# Patient Record
Sex: Male | Born: 1940 | Race: White | Hispanic: No | Smoking: Never smoker
Health system: Southern US, Community
[De-identification: ages and names within clinical notes are randomized; demographics above are authoritative.]

## PROBLEM LIST (undated history)

## (undated) DIAGNOSIS — M109 Gout, unspecified: Secondary | ICD-10-CM

## (undated) DIAGNOSIS — E782 Mixed hyperlipidemia: Secondary | ICD-10-CM

## (undated) DIAGNOSIS — F419 Anxiety disorder, unspecified: Secondary | ICD-10-CM

## (undated) DIAGNOSIS — C9110 Chronic lymphocytic leukemia of B-cell type not having achieved remission: Secondary | ICD-10-CM

## (undated) DIAGNOSIS — I10 Essential (primary) hypertension: Secondary | ICD-10-CM

## (undated) DIAGNOSIS — E119 Type 2 diabetes mellitus without complications: Secondary | ICD-10-CM

## (undated) DIAGNOSIS — Z972 Presence of dental prosthetic device (complete) (partial): Secondary | ICD-10-CM

## (undated) DIAGNOSIS — C9190 Lymphoid leukemia, unspecified not having achieved remission: Secondary | ICD-10-CM

## (undated) DIAGNOSIS — I2510 Atherosclerotic heart disease of native coronary artery without angina pectoris: Secondary | ICD-10-CM

## (undated) DIAGNOSIS — I219 Acute myocardial infarction, unspecified: Secondary | ICD-10-CM

## (undated) DIAGNOSIS — D239 Other benign neoplasm of skin, unspecified: Secondary | ICD-10-CM

## (undated) HISTORY — DX: Acute myocardial infarction, unspecified: I21.9

## (undated) HISTORY — DX: Type 2 diabetes mellitus without complications: E11.9

## (undated) HISTORY — DX: Other benign neoplasm of skin, unspecified: D23.9

## (undated) HISTORY — DX: Gout, unspecified: M10.9

## (undated) HISTORY — DX: Mixed hyperlipidemia: E78.2

## (undated) HISTORY — DX: Essential (primary) hypertension: I10

## (undated) HISTORY — PX: COLONOSCOPY: SHX174

## (undated) HISTORY — DX: Lymphoid leukemia, unspecified not having achieved remission: C91.90

## (undated) HISTORY — DX: Chronic lymphocytic leukemia of B-cell type not having achieved remission: C91.10

## (undated) HISTORY — PX: CARDIAC CATHETERIZATION: SHX172

## (undated) HISTORY — DX: Anxiety disorder, unspecified: F41.9

## (undated) HISTORY — DX: Atherosclerotic heart disease of native coronary artery without angina pectoris: I25.10

---

## 1996-01-23 DIAGNOSIS — D239 Other benign neoplasm of skin, unspecified: Secondary | ICD-10-CM

## 1996-01-23 DIAGNOSIS — I219 Acute myocardial infarction, unspecified: Secondary | ICD-10-CM

## 1996-01-23 HISTORY — DX: Acute myocardial infarction, unspecified: I21.9

## 1996-01-23 HISTORY — PX: CORONARY ARTERY BYPASS GRAFT: SHX141

## 1996-01-23 HISTORY — DX: Other benign neoplasm of skin, unspecified: D23.9

## 1997-11-25 ENCOUNTER — Other Ambulatory Visit: Admission: RE | Admit: 1997-11-25 | Discharge: 1997-11-25 | Payer: Self-pay | Admitting: Urology

## 1998-04-14 ENCOUNTER — Other Ambulatory Visit: Admission: RE | Admit: 1998-04-14 | Discharge: 1998-04-14 | Payer: Self-pay | Admitting: Urology

## 1998-10-24 ENCOUNTER — Encounter (INDEPENDENT_AMBULATORY_CARE_PROVIDER_SITE_OTHER): Payer: Self-pay

## 1998-10-24 ENCOUNTER — Ambulatory Visit (HOSPITAL_COMMUNITY): Admission: RE | Admit: 1998-10-24 | Discharge: 1998-10-24 | Payer: Self-pay | Admitting: Gastroenterology

## 1999-02-23 ENCOUNTER — Other Ambulatory Visit: Admission: RE | Admit: 1999-02-23 | Discharge: 1999-02-23 | Payer: Self-pay | Admitting: Urology

## 1999-08-22 ENCOUNTER — Encounter: Admission: RE | Admit: 1999-08-22 | Discharge: 1999-11-20 | Payer: Self-pay | Admitting: Obstetrics and Gynecology

## 1999-12-21 ENCOUNTER — Other Ambulatory Visit: Admission: RE | Admit: 1999-12-21 | Discharge: 1999-12-21 | Payer: Self-pay | Admitting: Urology

## 1999-12-21 ENCOUNTER — Encounter (INDEPENDENT_AMBULATORY_CARE_PROVIDER_SITE_OTHER): Payer: Self-pay | Admitting: Specialist

## 2001-04-15 ENCOUNTER — Ambulatory Visit (HOSPITAL_BASED_OUTPATIENT_CLINIC_OR_DEPARTMENT_OTHER): Admission: RE | Admit: 2001-04-15 | Discharge: 2001-04-15 | Payer: Self-pay | Admitting: Otolaryngology

## 2001-10-08 ENCOUNTER — Ambulatory Visit (HOSPITAL_COMMUNITY): Admission: RE | Admit: 2001-10-08 | Discharge: 2001-10-08 | Payer: Self-pay | Admitting: Cardiology

## 2002-05-07 ENCOUNTER — Ambulatory Visit (HOSPITAL_COMMUNITY): Admission: RE | Admit: 2002-05-07 | Discharge: 2002-05-07 | Payer: Self-pay | Admitting: Gastroenterology

## 2002-06-18 ENCOUNTER — Other Ambulatory Visit: Admission: RE | Admit: 2002-06-18 | Discharge: 2002-06-18 | Payer: Self-pay | Admitting: Oncology

## 2002-06-29 ENCOUNTER — Encounter: Payer: Self-pay | Admitting: General Surgery

## 2002-06-30 ENCOUNTER — Encounter (INDEPENDENT_AMBULATORY_CARE_PROVIDER_SITE_OTHER): Payer: Self-pay | Admitting: Specialist

## 2002-06-30 ENCOUNTER — Ambulatory Visit (HOSPITAL_COMMUNITY): Admission: RE | Admit: 2002-06-30 | Discharge: 2002-06-30 | Payer: Self-pay | Admitting: General Surgery

## 2002-09-24 ENCOUNTER — Other Ambulatory Visit: Admission: RE | Admit: 2002-09-24 | Discharge: 2002-09-24 | Payer: Self-pay | Admitting: Oncology

## 2002-10-21 ENCOUNTER — Ambulatory Visit (HOSPITAL_COMMUNITY): Admission: RE | Admit: 2002-10-21 | Discharge: 2002-10-21 | Payer: Self-pay | Admitting: Neurology

## 2002-10-21 ENCOUNTER — Encounter: Payer: Self-pay | Admitting: Neurology

## 2003-07-21 ENCOUNTER — Encounter (INDEPENDENT_AMBULATORY_CARE_PROVIDER_SITE_OTHER): Payer: Self-pay | Admitting: Specialist

## 2003-07-21 ENCOUNTER — Ambulatory Visit (HOSPITAL_COMMUNITY): Admission: RE | Admit: 2003-07-21 | Discharge: 2003-07-21 | Payer: Self-pay | Admitting: Gastroenterology

## 2003-12-10 ENCOUNTER — Ambulatory Visit: Payer: Self-pay | Admitting: Oncology

## 2004-03-09 ENCOUNTER — Ambulatory Visit: Payer: Self-pay | Admitting: Oncology

## 2004-06-01 ENCOUNTER — Ambulatory Visit: Payer: Self-pay | Admitting: Oncology

## 2004-07-05 ENCOUNTER — Ambulatory Visit (HOSPITAL_COMMUNITY): Admission: RE | Admit: 2004-07-05 | Discharge: 2004-07-05 | Payer: Self-pay | Admitting: Cardiology

## 2004-07-17 ENCOUNTER — Ambulatory Visit: Payer: Self-pay | Admitting: Oncology

## 2004-07-23 ENCOUNTER — Emergency Department (HOSPITAL_COMMUNITY): Admission: EM | Admit: 2004-07-23 | Discharge: 2004-07-23 | Payer: Self-pay | Admitting: Emergency Medicine

## 2004-08-07 ENCOUNTER — Encounter (INDEPENDENT_AMBULATORY_CARE_PROVIDER_SITE_OTHER): Payer: Self-pay | Admitting: *Deleted

## 2004-08-07 ENCOUNTER — Ambulatory Visit (HOSPITAL_COMMUNITY): Admission: RE | Admit: 2004-08-07 | Discharge: 2004-08-07 | Payer: Self-pay | Admitting: Surgery

## 2004-12-21 ENCOUNTER — Ambulatory Visit: Payer: Self-pay | Admitting: Oncology

## 2005-02-15 ENCOUNTER — Ambulatory Visit: Payer: Self-pay | Admitting: Oncology

## 2005-06-29 ENCOUNTER — Ambulatory Visit: Payer: Self-pay | Admitting: Oncology

## 2005-07-03 LAB — CBC WITH DIFFERENTIAL/PLATELET
BASO%: 0.8 % (ref 0.0–2.0)
Basophils Absolute: 0.9 10*3/uL — ABNORMAL HIGH (ref 0.0–0.1)
Eosinophils Absolute: 0.1 10*3/uL (ref 0.0–0.5)
HCT: 43.7 % (ref 38.7–49.9)
HGB: 14.2 g/dL (ref 13.0–17.1)
MONO#: 5 10*3/uL — ABNORMAL HIGH (ref 0.1–0.9)
NEUT%: 6.3 % — ABNORMAL LOW (ref 40.0–75.0)
WBC: 111.5 10*3/uL (ref 4.0–10.0)
lymph#: 98.5 10*3/uL — ABNORMAL HIGH (ref 0.9–3.3)

## 2005-07-05 LAB — IMMUNOFIXATION ELECTROPHORESIS: IgM, Serum: 28 mg/dL — ABNORMAL LOW (ref 60–263)

## 2005-07-05 LAB — COMPREHENSIVE METABOLIC PANEL
ALT: 29 U/L (ref 0–40)
BUN: 20 mg/dL (ref 6–23)
CO2: 26 mEq/L (ref 19–32)
Calcium: 9.3 mg/dL (ref 8.4–10.5)
Chloride: 106 mEq/L (ref 96–112)
Creatinine, Ser: 1.03 mg/dL (ref 0.40–1.50)
Glucose, Bld: 103 mg/dL — ABNORMAL HIGH (ref 70–99)

## 2005-07-05 LAB — BETA 2 MICROGLOBULIN, SERUM: Beta-2 Microglobulin: 2.12 mg/L — ABNORMAL HIGH (ref 1.01–1.73)

## 2005-08-29 ENCOUNTER — Ambulatory Visit: Payer: Self-pay | Admitting: Oncology

## 2005-08-31 LAB — CBC WITH DIFFERENTIAL/PLATELET
BASO%: 2.3 % — ABNORMAL HIGH (ref 0.0–2.0)
EOS%: 0.1 % (ref 0.0–7.0)
LYMPH%: 80 % — ABNORMAL HIGH (ref 14.0–48.0)
MCH: 28.8 pg (ref 28.0–33.4)
MCHC: 32.6 g/dL (ref 32.0–35.9)
MCV: 88.3 fL (ref 81.6–98.0)
MONO%: 9.5 % (ref 0.0–13.0)
Platelets: 172 10*3/uL (ref 145–400)
RBC: 4.74 10*6/uL (ref 4.20–5.71)
WBC: 119.3 10*3/uL (ref 4.0–10.0)

## 2005-09-04 LAB — COMPREHENSIVE METABOLIC PANEL
ALT: 19 U/L (ref 0–40)
Alkaline Phosphatase: 72 U/L (ref 39–117)
Creatinine, Ser: 1.06 mg/dL (ref 0.40–1.50)
Sodium: 141 mEq/L (ref 135–145)
Total Bilirubin: 1 mg/dL (ref 0.3–1.2)
Total Protein: 6.9 g/dL (ref 6.0–8.3)

## 2005-09-04 LAB — IMMUNOFIXATION ELECTROPHORESIS

## 2005-10-26 ENCOUNTER — Ambulatory Visit: Payer: Self-pay | Admitting: Oncology

## 2006-01-08 ENCOUNTER — Ambulatory Visit: Payer: Self-pay | Admitting: Oncology

## 2006-02-21 LAB — CBC WITH DIFFERENTIAL/PLATELET
Basophils Absolute: 0.9 10*3/uL — ABNORMAL HIGH (ref 0.0–0.1)
EOS%: 0 % (ref 0.0–7.0)
HCT: 44.6 % (ref 38.7–49.9)
HGB: 14.6 g/dL (ref 13.0–17.1)
MONO#: 3.7 10*3/uL — ABNORMAL HIGH (ref 0.1–0.9)
NEUT#: 10.1 10*3/uL — ABNORMAL HIGH (ref 1.5–6.5)
NEUT%: 6.6 % — ABNORMAL LOW (ref 40.0–75.0)
RDW: 15.8 % — ABNORMAL HIGH (ref 11.2–14.6)
WBC: 153.5 10*3/uL (ref 4.0–10.0)
lymph#: 138.8 10*3/uL — ABNORMAL HIGH (ref 0.9–3.3)

## 2006-02-22 LAB — IGG, IGA, IGM
IgA: 86 mg/dL (ref 68–378)
IgG (Immunoglobin G), Serum: 841 mg/dL (ref 694–1618)
IgM, Serum: 30 mg/dL — ABNORMAL LOW (ref 60–263)

## 2006-02-22 LAB — BETA 2 MICROGLOBULIN, SERUM: Beta-2 Microglobulin: 2.09 mg/L — ABNORMAL HIGH (ref 1.01–1.73)

## 2006-02-26 ENCOUNTER — Ambulatory Visit: Payer: Self-pay | Admitting: Oncology

## 2006-05-20 ENCOUNTER — Ambulatory Visit: Payer: Self-pay | Admitting: Oncology

## 2006-05-23 LAB — CBC WITH DIFFERENTIAL/PLATELET
EOS%: 0 % (ref 0.0–7.0)
LYMPH%: 92.4 % — ABNORMAL HIGH (ref 14.0–48.0)
MCH: 29.1 pg (ref 28.0–33.4)
MCV: 88.4 fL (ref 81.6–98.0)
MONO%: 1 % (ref 0.0–13.0)
RBC: 4.38 10*6/uL (ref 4.20–5.71)
RDW: 15.2 % — ABNORMAL HIGH (ref 11.2–14.6)

## 2006-05-23 LAB — MORPHOLOGY

## 2006-05-23 LAB — CHCC SMEAR

## 2006-05-27 LAB — FERRITIN: Ferritin: 31 ng/mL (ref 22–322)

## 2006-05-27 LAB — IMMUNOFIXATION ELECTROPHORESIS
IgG (Immunoglobin G), Serum: 709 mg/dL (ref 694–1618)
Total Protein, Serum Electrophoresis: 6.5 g/dL (ref 6.0–8.3)

## 2006-05-27 LAB — BETA 2 MICROGLOBULIN, SERUM: Beta-2 Microglobulin: 2 mg/L — ABNORMAL HIGH (ref 1.01–1.73)

## 2006-07-09 ENCOUNTER — Ambulatory Visit: Payer: Self-pay | Admitting: Oncology

## 2006-07-11 LAB — CBC & DIFF AND RETIC
BASO%: 0.4 % (ref 0.0–2.0)
EOS%: 0 % (ref 0.0–7.0)
LYMPH%: 91.6 % — ABNORMAL HIGH (ref 14.0–48.0)
MCH: 28.9 pg (ref 28.0–33.4)
MCHC: 32.5 g/dL (ref 32.0–35.9)
MCV: 88.8 fL (ref 81.6–98.0)
MONO%: 2.7 % (ref 0.0–13.0)
NEUT#: 7.9 10*3/uL — ABNORMAL HIGH (ref 1.5–6.5)
Platelets: 167 10*3/uL (ref 145–400)
RBC: 4.61 10*6/uL (ref 4.20–5.71)
RDW: 15.8 % — ABNORMAL HIGH (ref 11.2–14.6)

## 2006-07-11 LAB — MORPHOLOGY

## 2006-07-11 LAB — CHCC SMEAR

## 2006-07-12 LAB — VITAMIN B12: Vitamin B-12: 344 pg/mL (ref 211–911)

## 2006-09-15 ENCOUNTER — Ambulatory Visit: Payer: Self-pay | Admitting: Oncology

## 2006-09-18 LAB — CBC & DIFF AND RETIC
HCT: 39.3 % (ref 38.7–49.9)
HGB: 12.7 g/dL — ABNORMAL LOW (ref 13.0–17.1)
MCH: 28.9 pg (ref 28.0–33.4)
MCHC: 32.2 g/dL (ref 32.0–35.9)
MCV: 89.6 fL (ref 81.6–98.0)
Platelets: 163 10*3/uL (ref 145–400)
RBC: 4.38 10*6/uL (ref 4.20–5.71)
RDW: 15.9 % — ABNORMAL HIGH (ref 11.2–14.6)
WBC: 140.5 10*3/uL (ref 4.0–10.0)

## 2006-09-18 LAB — MANUAL DIFFERENTIAL
ALC: 132.1 10*3/uL — ABNORMAL HIGH (ref 0.9–3.3)
ANC (CHCC manual diff): 4.2 10*3/uL (ref 1.5–6.5)
Band Neutrophils: 0 % (ref 0–10)
Basophil: 0 % (ref 0–2)
Blasts: 0 % (ref 0–0)
LYMPH: 94 % — ABNORMAL HIGH (ref 14–49)
Other Cell: 0 % (ref 0–0)
PLT EST: ADEQUATE
PROMYELO: 0 % (ref 0–0)
SEG: 3 % — ABNORMAL LOW (ref 38–77)
Variant Lymph: 0 % (ref 0–0)
nRBC: 0 % (ref 0–0)

## 2006-09-18 LAB — RETICULOCYTES (CHCC): Retic Ct Pct: 1.6 % (ref 0.4–3.1)

## 2006-09-19 LAB — COMPREHENSIVE METABOLIC PANEL
ALT: 13 U/L (ref 0–53)
CO2: 27 mEq/L (ref 19–32)
Calcium: 9 mg/dL (ref 8.4–10.5)
Chloride: 104 mEq/L (ref 96–112)
Creatinine, Ser: 1.1 mg/dL (ref 0.40–1.50)
Glucose, Bld: 106 mg/dL — ABNORMAL HIGH (ref 70–99)

## 2006-09-19 LAB — FERRITIN: Ferritin: 33 ng/mL (ref 22–322)

## 2006-09-19 LAB — LACTATE DEHYDROGENASE: LDH: 151 U/L (ref 94–250)

## 2006-12-30 ENCOUNTER — Ambulatory Visit: Payer: Self-pay | Admitting: Oncology

## 2007-01-01 LAB — COMPREHENSIVE METABOLIC PANEL
AST: 24 U/L (ref 0–37)
Albumin: 4 g/dL (ref 3.5–5.2)
Alkaline Phosphatase: 74 U/L (ref 39–117)
BUN: 11 mg/dL (ref 6–23)
Calcium: 9.4 mg/dL (ref 8.4–10.5)
Chloride: 107 mEq/L (ref 96–112)
Creatinine, Ser: 1.02 mg/dL (ref 0.40–1.50)
Glucose, Bld: 103 mg/dL — ABNORMAL HIGH (ref 70–99)
Potassium: 4.5 mEq/L (ref 3.5–5.3)

## 2007-01-01 LAB — MORPHOLOGY: PLT EST: ADEQUATE

## 2007-01-01 LAB — CBC & DIFF AND RETIC
BASO%: 0.5 % (ref 0.0–2.0)
EOS%: 0.1 % (ref 0.0–7.0)
HGB: 13.4 g/dL (ref 13.0–17.1)
MCH: 29 pg (ref 28.0–33.4)
MCHC: 32.7 g/dL (ref 32.0–35.9)
RDW: 15.5 % — ABNORMAL HIGH (ref 11.2–14.6)
WBC: 136.6 10*3/uL (ref 4.0–10.0)
lymph#: 128.4 10*3/uL — ABNORMAL HIGH (ref 0.9–3.3)

## 2007-01-01 LAB — CHCC SMEAR

## 2007-01-02 LAB — IGG, IGA, IGM
IgA: 71 mg/dL (ref 68–378)
IgG (Immunoglobin G), Serum: 718 mg/dL (ref 694–1618)
IgM, Serum: 22 mg/dL — ABNORMAL LOW (ref 60–263)

## 2007-01-02 LAB — RETICULOCYTES (CHCC)
ABS Retic: 42.3 10*3/uL (ref 19.0–186.0)
RBC.: 4.7 MIL/uL (ref 4.22–5.81)

## 2007-04-15 ENCOUNTER — Ambulatory Visit: Payer: Self-pay | Admitting: Oncology

## 2007-04-24 LAB — URINALYSIS, MICROSCOPIC - CHCC
Ketones: NEGATIVE mg/dL
Leukocyte Esterase: NEGATIVE
Nitrite: NEGATIVE
Protein: NEGATIVE mg/dL

## 2007-05-27 ENCOUNTER — Ambulatory Visit: Payer: Self-pay | Admitting: Oncology

## 2007-06-30 LAB — CBC WITH DIFFERENTIAL/PLATELET
BASO%: 0.4 % (ref 0.0–2.0)
HCT: 40.2 % (ref 38.7–49.9)
HGB: 12.8 g/dL — ABNORMAL LOW (ref 13.0–17.1)
MCHC: 31.8 g/dL — ABNORMAL LOW (ref 32.0–35.9)
MONO#: 2.2 10*3/uL — ABNORMAL HIGH (ref 0.1–0.9)
NEUT%: 4.6 % — ABNORMAL LOW (ref 40.0–75.0)
RDW: 15.4 % — ABNORMAL HIGH (ref 11.2–14.6)
WBC: 130.1 10*3/uL (ref 4.0–10.0)
lymph#: 121.3 10*3/uL — ABNORMAL HIGH (ref 0.9–3.3)

## 2007-06-30 LAB — MORPHOLOGY

## 2007-06-30 LAB — CHCC SMEAR

## 2007-08-29 ENCOUNTER — Ambulatory Visit (HOSPITAL_COMMUNITY): Admission: RE | Admit: 2007-08-29 | Discharge: 2007-08-29 | Payer: Self-pay | Admitting: General Surgery

## 2007-09-16 ENCOUNTER — Ambulatory Visit: Payer: Self-pay | Admitting: Oncology

## 2007-09-18 LAB — MORPHOLOGY: PLT EST: ADEQUATE

## 2007-09-18 LAB — CBC WITH DIFFERENTIAL/PLATELET
Basophils Absolute: 0.4 10*3/uL — ABNORMAL HIGH (ref 0.0–0.1)
Eosinophils Absolute: 0.3 10*3/uL (ref 0.0–0.5)
HGB: 11.2 g/dL — ABNORMAL LOW (ref 13.0–17.1)
LYMPH%: 82.3 % — ABNORMAL HIGH (ref 14.0–48.0)
MCV: 89.3 fL (ref 81.6–98.0)
MONO%: 2.2 % (ref 0.0–13.0)
NEUT#: 24 10*3/uL — ABNORMAL HIGH (ref 1.5–6.5)
Platelets: 227 10*3/uL (ref 145–400)

## 2007-10-02 LAB — CBC WITH DIFFERENTIAL/PLATELET
EOS%: 0.1 % (ref 0.0–7.0)
Eosinophils Absolute: 0.1 10*3/uL (ref 0.0–0.5)
MCV: 89.3 fL (ref 81.6–98.0)
MONO%: 1.8 % (ref 0.0–13.0)
NEUT#: 7.5 10*3/uL — ABNORMAL HIGH (ref 1.5–6.5)
RBC: 4.02 10*6/uL — ABNORMAL LOW (ref 4.20–5.71)
RDW: 17 % — ABNORMAL HIGH (ref 11.2–14.6)

## 2007-10-02 LAB — CHCC SMEAR

## 2007-10-02 LAB — MORPHOLOGY

## 2007-10-06 LAB — CBC WITH DIFFERENTIAL/PLATELET
Basophils Absolute: 0.2 10*3/uL — ABNORMAL HIGH (ref 0.0–0.1)
EOS%: 0 % (ref 0.0–7.0)
HCT: 38.1 % — ABNORMAL LOW (ref 38.7–49.9)
HGB: 12.1 g/dL — ABNORMAL LOW (ref 13.0–17.1)
LYMPH%: 88.7 % — ABNORMAL HIGH (ref 14.0–48.0)
MCH: 28.2 pg (ref 28.0–33.4)
MCV: 88.9 fL (ref 81.6–98.0)
MONO%: 0.6 % (ref 0.0–13.0)
NEUT%: 10.5 % — ABNORMAL LOW (ref 40.0–75.0)

## 2007-10-28 LAB — CHCC SMEAR

## 2007-10-28 LAB — CBC WITH DIFFERENTIAL/PLATELET
Basophils Absolute: 0.7 10*3/uL — ABNORMAL HIGH (ref 0.0–0.1)
HCT: 42.6 % (ref 38.7–49.9)
HGB: 13.5 g/dL (ref 13.0–17.1)
LYMPH%: 92.2 % — ABNORMAL HIGH (ref 14.0–48.0)
MCH: 27.5 pg — ABNORMAL LOW (ref 28.0–33.4)
MCHC: 31.7 g/dL — ABNORMAL LOW (ref 32.0–35.9)
MONO#: 3.1 10*3/uL — ABNORMAL HIGH (ref 0.1–0.9)
NEUT%: 4.6 % — ABNORMAL LOW (ref 40.0–75.0)
Platelets: 144 10*3/uL — ABNORMAL LOW (ref 145–400)
WBC: 128.7 10*3/uL (ref 4.0–10.0)
lymph#: 118.7 10*3/uL — ABNORMAL HIGH (ref 0.9–3.3)

## 2007-10-28 LAB — MORPHOLOGY: PLT EST: DECREASED

## 2007-10-30 LAB — IMMUNOFIXATION ELECTROPHORESIS: IgM, Serum: 26 mg/dL — ABNORMAL LOW (ref 60–263)

## 2007-10-30 LAB — LACTATE DEHYDROGENASE: LDH: 146 U/L (ref 94–250)

## 2007-10-30 LAB — URIC ACID: Uric Acid, Serum: 7 mg/dL (ref 4.0–7.8)

## 2007-11-28 ENCOUNTER — Ambulatory Visit: Payer: Self-pay | Admitting: Oncology

## 2007-12-02 LAB — CBC & DIFF AND RETIC
Eosinophils Absolute: 0.1 10*3/uL (ref 0.0–0.5)
HCT: 41.9 % (ref 38.7–49.9)
LYMPH%: 91.3 % — ABNORMAL HIGH (ref 14.0–48.0)
MCHC: 31.8 g/dL — ABNORMAL LOW (ref 32.0–35.9)
MCV: 86.8 fL (ref 81.6–98.0)
MONO#: 1.8 10*3/uL — ABNORMAL HIGH (ref 0.1–0.9)
NEUT%: 6.6 % — ABNORMAL LOW (ref 40.0–75.0)
Platelets: 132 10*3/uL — ABNORMAL LOW (ref 145–400)
WBC: 146.7 10*3/uL (ref 4.0–10.0)

## 2007-12-02 LAB — MORPHOLOGY

## 2007-12-02 LAB — RETICULOCYTES (CHCC): ABS Retic: 44.1 10*3/uL (ref 19.0–186.0)

## 2007-12-04 LAB — COMPREHENSIVE METABOLIC PANEL
BUN: 17 mg/dL (ref 6–23)
CO2: 26 mEq/L (ref 19–32)
Calcium: 10.2 mg/dL (ref 8.4–10.5)
Chloride: 106 mEq/L (ref 96–112)
Creatinine, Ser: 1.2 mg/dL (ref 0.40–1.50)

## 2007-12-04 LAB — BETA 2 MICROGLOBULIN, SERUM: Beta-2 Microglobulin: 2.37 mg/L — ABNORMAL HIGH (ref 1.01–1.73)

## 2007-12-04 LAB — IMMUNOFIXATION ELECTROPHORESIS
IgA: 63 mg/dL — ABNORMAL LOW (ref 68–378)
IgG (Immunoglobin G), Serum: 721 mg/dL (ref 694–1618)
Total Protein, Serum Electrophoresis: 6.9 g/dL (ref 6.0–8.3)

## 2008-01-23 HISTORY — PX: HERNIA REPAIR: SHX51

## 2008-01-23 HISTORY — PX: CHOLECYSTECTOMY: SHX55

## 2008-02-03 ENCOUNTER — Ambulatory Visit (HOSPITAL_BASED_OUTPATIENT_CLINIC_OR_DEPARTMENT_OTHER): Admission: RE | Admit: 2008-02-03 | Discharge: 2008-02-03 | Payer: Self-pay | Admitting: Orthopedic Surgery

## 2008-03-05 ENCOUNTER — Ambulatory Visit: Payer: Self-pay | Admitting: Oncology

## 2008-03-10 LAB — CBC WITH DIFFERENTIAL/PLATELET
BASO%: 0.3 % (ref 0.0–2.0)
Basophils Absolute: 0.5 10*3/uL — ABNORMAL HIGH (ref 0.0–0.1)
EOS%: 0 % (ref 0.0–7.0)
HGB: 13.5 g/dL (ref 13.0–17.1)
MCH: 27.5 pg — ABNORMAL LOW (ref 28.0–33.4)
MCHC: 31.5 g/dL — ABNORMAL LOW (ref 32.0–35.9)
MCV: 87.2 fL (ref 81.6–98.0)
MONO%: 1.9 % (ref 0.0–13.0)
RBC: 4.92 10*6/uL (ref 4.20–5.71)
RDW: 17 % — ABNORMAL HIGH (ref 11.2–14.6)
lymph#: 143.4 10*3/uL — ABNORMAL HIGH (ref 0.9–3.3)

## 2008-03-10 LAB — MORPHOLOGY: PLT EST: DECREASED

## 2008-03-10 LAB — CHCC SMEAR

## 2008-03-11 LAB — COMPREHENSIVE METABOLIC PANEL
ALT: 13 U/L (ref 0–53)
AST: 17 U/L (ref 0–37)
Alkaline Phosphatase: 81 U/L (ref 39–117)
Glucose, Bld: 114 mg/dL — ABNORMAL HIGH (ref 70–99)
Sodium: 142 mEq/L (ref 135–145)
Total Bilirubin: 1.1 mg/dL (ref 0.3–1.2)
Total Protein: 7.2 g/dL (ref 6.0–8.3)

## 2008-03-11 LAB — URIC ACID: Uric Acid, Serum: 7.2 mg/dL (ref 4.0–7.8)

## 2008-03-11 LAB — IGG, IGA, IGM: IgM, Serum: 26 mg/dL — ABNORMAL LOW (ref 60–263)

## 2008-03-11 LAB — BETA 2 MICROGLOBULIN, SERUM: Beta-2 Microglobulin: 2.47 mg/L — ABNORMAL HIGH (ref 1.01–1.73)

## 2008-05-04 ENCOUNTER — Ambulatory Visit: Payer: Self-pay | Admitting: Oncology

## 2008-05-06 LAB — MORPHOLOGY: PLT EST: DECREASED

## 2008-05-06 LAB — CBC WITH DIFFERENTIAL/PLATELET
BASO%: 0.4 % (ref 0.0–2.0)
Eosinophils Absolute: 0.1 10*3/uL (ref 0.0–0.5)
HCT: 40.4 % (ref 38.4–49.9)
LYMPH%: 95.1 % — ABNORMAL HIGH (ref 14.0–49.0)
MCHC: 31.7 g/dL — ABNORMAL LOW (ref 32.0–36.0)
MCV: 85.4 fL (ref 79.3–98.0)
MONO%: 1.9 % (ref 0.0–14.0)
NEUT%: 2.6 % — ABNORMAL LOW (ref 39.0–75.0)
Platelets: 119 10*3/uL — ABNORMAL LOW (ref 140–400)
RBC: 4.73 10*6/uL (ref 4.20–5.82)
nRBC: 0 % (ref 0–0)

## 2008-05-06 LAB — CHCC SMEAR

## 2008-05-10 LAB — IMMUNOFIXATION ELECTROPHORESIS
IgA: 61 mg/dL — ABNORMAL LOW (ref 68–378)
IgG (Immunoglobin G), Serum: 689 mg/dL — ABNORMAL LOW (ref 694–1618)
IgM, Serum: 20 mg/dL — ABNORMAL LOW (ref 60–263)
Total Protein, Serum Electrophoresis: 6.4 g/dL (ref 6.0–8.3)

## 2008-05-10 LAB — URIC ACID: Uric Acid, Serum: 6.9 mg/dL (ref 4.0–7.8)

## 2008-05-10 LAB — BETA 2 MICROGLOBULIN, SERUM: Beta-2 Microglobulin: 2 mg/L — ABNORMAL HIGH (ref 1.01–1.73)

## 2008-05-10 LAB — LACTATE DEHYDROGENASE: LDH: 149 U/L (ref 94–250)

## 2008-07-07 ENCOUNTER — Ambulatory Visit: Payer: Self-pay | Admitting: Oncology

## 2008-07-07 LAB — CBC WITH DIFFERENTIAL/PLATELET
Basophils Absolute: 0.6 10*3/uL — ABNORMAL HIGH (ref 0.0–0.1)
Eosinophils Absolute: 0.2 10*3/uL (ref 0.0–0.5)
HGB: 12.9 g/dL — ABNORMAL LOW (ref 13.0–17.1)
MCV: 86.6 fL (ref 79.3–98.0)
MONO#: 2.6 10*3/uL — ABNORMAL HIGH (ref 0.1–0.9)
NEUT#: 5.2 10*3/uL (ref 1.5–6.5)
Platelets: 116 10*3/uL — ABNORMAL LOW (ref 140–400)
RBC: 4.7 10*6/uL (ref 4.20–5.82)
RDW: 15.7 % — ABNORMAL HIGH (ref 11.0–14.6)
WBC: 162.4 10*3/uL (ref 4.0–10.3)
nRBC: 0 % (ref 0–0)

## 2008-07-07 LAB — COMPREHENSIVE METABOLIC PANEL
ALT: 16 U/L (ref 0–53)
Albumin: 4.1 g/dL (ref 3.5–5.2)
CO2: 31 mEq/L (ref 19–32)
Calcium: 9.6 mg/dL (ref 8.4–10.5)
Chloride: 104 mEq/L (ref 96–112)
Glucose, Bld: 139 mg/dL — ABNORMAL HIGH (ref 70–99)
Sodium: 141 mEq/L (ref 135–145)
Total Protein: 6.4 g/dL (ref 6.0–8.3)

## 2008-07-07 LAB — URIC ACID: Uric Acid, Serum: 6 mg/dL (ref 4.0–7.8)

## 2008-07-07 LAB — LACTATE DEHYDROGENASE: LDH: 132 U/L (ref 94–250)

## 2008-07-07 LAB — MORPHOLOGY: PLT EST: DECREASED

## 2008-07-08 LAB — IGG, IGA, IGM: IgG (Immunoglobin G), Serum: 618 mg/dL — ABNORMAL LOW (ref 694–1618)

## 2008-07-08 LAB — BETA 2 MICROGLOBULIN, SERUM: Beta-2 Microglobulin: 2.42 mg/L — ABNORMAL HIGH (ref 1.01–1.73)

## 2008-08-24 ENCOUNTER — Ambulatory Visit: Payer: Self-pay | Admitting: Oncology

## 2008-08-31 LAB — CBC WITH DIFFERENTIAL/PLATELET
Basophils Absolute: 0.6 10*3/uL — ABNORMAL HIGH (ref 0.0–0.1)
EOS%: 0.1 % (ref 0.0–7.0)
Eosinophils Absolute: 0.2 10*3/uL (ref 0.0–0.5)
HCT: 40.8 % (ref 38.4–49.9)
HGB: 13 g/dL (ref 13.0–17.1)
MONO#: 0.3 10*3/uL (ref 0.1–0.9)
NEUT#: 5.3 10*3/uL (ref 1.5–6.5)
NEUT%: 3.1 % — ABNORMAL LOW (ref 39.0–75.0)
RDW: 15.4 % — ABNORMAL HIGH (ref 11.0–14.6)
WBC: 170.4 10*3/uL (ref 4.0–10.3)
lymph#: 164.1 10*3/uL — ABNORMAL HIGH (ref 0.9–3.3)

## 2008-08-31 LAB — MORPHOLOGY: PLT EST: DECREASED

## 2008-08-31 LAB — URIC ACID: Uric Acid, Serum: 6.6 mg/dL (ref 4.0–7.8)

## 2008-08-31 LAB — LACTATE DEHYDROGENASE: LDH: 156 U/L (ref 94–250)

## 2008-08-31 LAB — CHCC SMEAR

## 2008-09-01 LAB — IGG, IGA, IGM
IgA: 60 mg/dL — ABNORMAL LOW (ref 68–378)
IgG (Immunoglobin G), Serum: 646 mg/dL — ABNORMAL LOW (ref 694–1618)
IgM, Serum: 17 mg/dL — ABNORMAL LOW (ref 60–263)

## 2008-09-01 LAB — BETA 2 MICROGLOBULIN, SERUM: Beta-2 Microglobulin: 2.25 mg/L — ABNORMAL HIGH (ref 1.01–1.73)

## 2008-09-23 ENCOUNTER — Ambulatory Visit: Payer: Self-pay | Admitting: Oncology

## 2008-09-23 LAB — CBC WITH DIFFERENTIAL/PLATELET
BASO%: 0.2 % (ref 0.0–2.0)
EOS%: 0.2 % (ref 0.0–7.0)
MCH: 28.3 pg (ref 27.2–33.4)
MCV: 89.2 fL (ref 79.3–98.0)
MONO%: 1.5 % (ref 0.0–14.0)
NEUT#: 20.7 10*3/uL — ABNORMAL HIGH (ref 1.5–6.5)
RBC: 4.55 10*6/uL (ref 4.20–5.82)
RDW: 16.1 % — ABNORMAL HIGH (ref 11.0–14.6)

## 2008-09-23 LAB — MORPHOLOGY

## 2008-09-23 LAB — CHCC SMEAR

## 2008-09-24 LAB — LACTATE DEHYDROGENASE: LDH: 154 U/L (ref 94–250)

## 2008-09-24 LAB — URIC ACID: Uric Acid, Serum: 8.5 mg/dL — ABNORMAL HIGH (ref 4.0–7.8)

## 2008-11-18 ENCOUNTER — Ambulatory Visit: Payer: Self-pay | Admitting: Oncology

## 2008-11-23 LAB — MANUAL DIFFERENTIAL
ALC: 194.1 10*3/uL — ABNORMAL HIGH (ref 0.9–3.3)
ANC (CHCC manual diff): 6.1 10*3/uL (ref 1.5–6.5)
Band Neutrophils: 0 % (ref 0–10)
Blasts: 0 % (ref 0–0)
LYMPH: 96 % — ABNORMAL HIGH (ref 14–49)
MONO: 1 % (ref 0–14)
Metamyelocytes: 0 % (ref 0–0)
Variant Lymph: 0 % (ref 0–0)
nRBC: 0 % (ref 0–0)

## 2008-11-23 LAB — CBC WITH DIFFERENTIAL/PLATELET
HCT: 44.3 % (ref 38.4–49.9)
MCHC: 31.5 g/dL — ABNORMAL LOW (ref 32.0–36.0)
MCV: 89.8 fL (ref 79.3–98.0)
Platelets: 134 10*3/uL — ABNORMAL LOW (ref 140–400)
RBC: 4.94 10*6/uL (ref 4.20–5.82)
WBC: 202.2 10*3/uL (ref 4.0–10.3)

## 2008-11-24 LAB — COMPREHENSIVE METABOLIC PANEL
ALT: 11 U/L (ref 0–53)
AST: 14 U/L (ref 0–37)
Albumin: 4.8 g/dL (ref 3.5–5.2)
BUN: 19 mg/dL (ref 6–23)
Calcium: 10.2 mg/dL (ref 8.4–10.5)
Chloride: 106 mEq/L (ref 96–112)
Potassium: 3.7 mEq/L (ref 3.5–5.3)

## 2008-11-24 LAB — URIC ACID: Uric Acid, Serum: 8.1 mg/dL — ABNORMAL HIGH (ref 4.0–7.8)

## 2009-01-13 ENCOUNTER — Ambulatory Visit: Payer: Self-pay | Admitting: Oncology

## 2009-01-27 LAB — CHCC SMEAR

## 2009-01-27 LAB — CBC WITH DIFFERENTIAL/PLATELET
HCT: 44.2 % (ref 38.4–49.9)
MCH: 27.8 pg (ref 27.2–33.4)
RDW: 15.9 % — ABNORMAL HIGH (ref 11.0–14.6)
nRBC: 0 % (ref 0–0)

## 2009-01-27 LAB — MANUAL DIFFERENTIAL
ALC: 206.5 10*3/uL — ABNORMAL HIGH (ref 0.9–3.3)
Band Neutrophils: 0 % (ref 0–10)
Basophil: 0 % (ref 0–2)
LYMPH: 97 % — ABNORMAL HIGH (ref 14–49)
RBC Comments: NORMAL
Variant Lymph: 0 % (ref 0–0)
nRBC: 0 % (ref 0–0)

## 2009-01-27 LAB — COMPREHENSIVE METABOLIC PANEL
Albumin: 4.1 g/dL (ref 3.5–5.2)
Calcium: 10 mg/dL (ref 8.4–10.5)
Total Bilirubin: 1.4 mg/dL — ABNORMAL HIGH (ref 0.3–1.2)
Total Protein: 7.3 g/dL (ref 6.0–8.3)

## 2009-01-27 LAB — URIC ACID: Uric Acid, Serum: 6.6 mg/dL (ref 4.0–7.8)

## 2009-01-28 LAB — BETA 2 MICROGLOBULIN, SERUM: Beta-2 Microglobulin: 2.77 mg/L — ABNORMAL HIGH (ref 1.01–1.73)

## 2009-03-11 ENCOUNTER — Ambulatory Visit: Payer: Self-pay | Admitting: Oncology

## 2009-03-15 LAB — CBC WITH DIFFERENTIAL/PLATELET
BASO%: 0.3 % (ref 0.0–2.0)
Basophils Absolute: 0.5 10*3/uL — ABNORMAL HIGH (ref 0.0–0.1)
EOS%: 0.2 % (ref 0.0–7.0)
Eosinophils Absolute: 0.3 10*3/uL (ref 0.0–0.5)
HGB: 13.2 g/dL (ref 13.0–17.1)
MCH: 28.9 pg (ref 27.2–33.4)
NEUT#: 19.3 10*3/uL — ABNORMAL HIGH (ref 1.5–6.5)
RBC: 4.56 10*6/uL (ref 4.20–5.82)
RDW: 16.4 % — ABNORMAL HIGH (ref 11.0–14.6)

## 2009-03-15 LAB — MORPHOLOGY

## 2009-03-16 LAB — COMPREHENSIVE METABOLIC PANEL
Albumin: 4.8 g/dL (ref 3.5–5.2)
CO2: 26 mEq/L (ref 19–32)
Creatinine, Ser: 1.2 mg/dL (ref 0.40–1.50)
Glucose, Bld: 118 mg/dL — ABNORMAL HIGH (ref 70–99)
Sodium: 143 mEq/L (ref 135–145)
Total Bilirubin: 1.4 mg/dL — ABNORMAL HIGH (ref 0.3–1.2)
Total Protein: 6.8 g/dL (ref 6.0–8.3)

## 2009-03-16 LAB — URIC ACID: Uric Acid, Serum: 7.3 mg/dL (ref 4.0–7.8)

## 2009-03-16 LAB — LACTATE DEHYDROGENASE: LDH: 149 U/L (ref 94–250)

## 2009-04-15 ENCOUNTER — Ambulatory Visit: Payer: Self-pay | Admitting: Oncology

## 2009-04-19 LAB — CBC WITH DIFFERENTIAL/PLATELET
MCH: 29 pg (ref 27.2–33.4)
MCHC: 32.1 g/dL (ref 32.0–36.0)
RBC: 4.68 10*6/uL (ref 4.20–5.82)
RDW: 16 % — ABNORMAL HIGH (ref 11.0–14.6)
WBC: 184.8 10*3/uL (ref 4.0–10.3)

## 2009-04-19 LAB — MANUAL DIFFERENTIAL
ALC: 179.3 10*3/uL — ABNORMAL HIGH (ref 0.9–3.3)
ANC (CHCC manual diff): 3.7 10*3/uL (ref 1.5–6.5)
EOS: 0 % (ref 0–7)
LYMPH: 97 % — ABNORMAL HIGH (ref 14–49)
PLT EST: DECREASED
PROMYELO: 0 % (ref 0–0)
SEG: 2 % — ABNORMAL LOW (ref 38–77)
Variant Lymph: 0 % (ref 0–0)
nRBC: 0 % (ref 0–0)

## 2009-04-19 LAB — COMPREHENSIVE METABOLIC PANEL
AST: 25 U/L (ref 0–37)
Calcium: 9.5 mg/dL (ref 8.4–10.5)
Chloride: 106 mEq/L (ref 96–112)
Creatinine, Ser: 1.26 mg/dL (ref 0.40–1.50)
Potassium: 4.6 mEq/L (ref 3.5–5.3)
Sodium: 142 mEq/L (ref 135–145)
Total Bilirubin: 1.7 mg/dL — ABNORMAL HIGH (ref 0.3–1.2)
Total Protein: 7.4 g/dL (ref 6.0–8.3)

## 2009-04-20 LAB — BETA 2 MICROGLOBULIN, SERUM: Beta-2 Microglobulin: 2.64 mg/L — ABNORMAL HIGH (ref 1.01–1.73)

## 2009-08-30 ENCOUNTER — Ambulatory Visit: Payer: Self-pay | Admitting: Oncology

## 2009-09-02 LAB — MANUAL DIFFERENTIAL
ALC: 174.2 10*3/uL — ABNORMAL HIGH (ref 0.9–3.3)
Band Neutrophils: 0 % (ref 0–10)
Basophil: 1 % (ref 0–2)
LYMPH: 93 % — ABNORMAL HIGH (ref 14–49)
MONO: 0 % (ref 0–14)
Myelocytes: 0 % (ref 0–0)
PROMYELO: 0 % (ref 0–0)
Variant Lymph: 0 % (ref 0–0)
nRBC: 0 % (ref 0–0)

## 2009-09-02 LAB — CBC WITH DIFFERENTIAL/PLATELET
HCT: 39.2 % (ref 38.4–49.9)
MCH: 29.5 pg (ref 27.2–33.4)
MCV: 90.1 fL (ref 79.3–98.0)
Platelets: 129 10*3/uL — ABNORMAL LOW (ref 140–400)
WBC: 187.3 10*3/uL (ref 4.0–10.3)

## 2009-09-05 LAB — COMPREHENSIVE METABOLIC PANEL
ALT: 12 U/L (ref 0–53)
AST: 16 U/L (ref 0–37)
Alkaline Phosphatase: 64 U/L (ref 39–117)
CO2: 24 mEq/L (ref 19–32)
Chloride: 108 mEq/L (ref 96–112)
Creatinine, Ser: 1.2 mg/dL (ref 0.40–1.50)
Glucose, Bld: 132 mg/dL — ABNORMAL HIGH (ref 70–99)
Potassium: 3.9 mEq/L (ref 3.5–5.3)

## 2009-09-05 LAB — BETA 2 MICROGLOBULIN, SERUM: Beta-2 Microglobulin: 3.26 mg/L — ABNORMAL HIGH (ref 1.01–1.73)

## 2009-10-18 ENCOUNTER — Ambulatory Visit: Payer: Self-pay | Admitting: Oncology

## 2009-10-20 LAB — MANUAL DIFFERENTIAL
Band Neutrophils: 0 % (ref 0–10)
Blasts: 0 % (ref 0–0)
MONO: 1 % (ref 0–14)
Metamyelocytes: 0 % (ref 0–0)
PROMYELO: 0 % (ref 0–0)

## 2009-10-20 LAB — CBC WITH DIFFERENTIAL/PLATELET
HCT: 40.5 % (ref 38.4–49.9)
HGB: 12.9 g/dL — ABNORMAL LOW (ref 13.0–17.1)
MCH: 28.4 pg (ref 27.2–33.4)
MCHC: 31.8 g/dL — ABNORMAL LOW (ref 32.0–36.0)
RBC: 4.52 10*6/uL (ref 4.20–5.82)
WBC: 169.8 10*3/uL (ref 4.0–10.3)

## 2009-10-20 LAB — COMPREHENSIVE METABOLIC PANEL
ALT: 16 U/L (ref 0–53)
Albumin: 4.1 g/dL (ref 3.5–5.2)
CO2: 29 mEq/L (ref 19–32)
Creatinine, Ser: 1.4 mg/dL (ref 0.40–1.50)
Glucose, Bld: 147 mg/dL — ABNORMAL HIGH (ref 70–99)
Potassium: 4.2 mEq/L (ref 3.5–5.3)

## 2009-12-01 ENCOUNTER — Ambulatory Visit: Payer: Self-pay | Admitting: Oncology

## 2009-12-05 LAB — COMPREHENSIVE METABOLIC PANEL
CO2: 30 mEq/L (ref 19–32)
Calcium: 9.7 mg/dL (ref 8.4–10.5)
Chloride: 105 mEq/L (ref 96–112)
Glucose, Bld: 98 mg/dL (ref 70–99)
Potassium: 4.8 mEq/L (ref 3.5–5.3)
Sodium: 144 mEq/L (ref 135–145)
Total Protein: 6.7 g/dL (ref 6.0–8.3)

## 2009-12-05 LAB — URIC ACID: Uric Acid, Serum: 7.2 mg/dL (ref 4.0–7.8)

## 2009-12-05 LAB — CBC WITH DIFFERENTIAL/PLATELET
BASO%: 1 % (ref 0.0–2.0)
EOS%: 0.1 % (ref 0.0–7.0)
MCH: 29 pg (ref 27.2–33.4)
MCHC: 31.8 g/dL — ABNORMAL LOW (ref 32.0–36.0)
MCV: 91 fL (ref 79.3–98.0)
MONO%: 2.2 % (ref 0.0–14.0)
NEUT#: 9.3 10*3/uL — ABNORMAL HIGH (ref 1.5–6.5)
NEUT%: 5.3 % — ABNORMAL LOW (ref 39.0–75.0)
Platelets: 134 10*3/uL — ABNORMAL LOW (ref 140–400)
WBC: 175.9 10*3/uL (ref 4.0–10.3)

## 2009-12-05 LAB — MORPHOLOGY: PLT EST: DECREASED

## 2009-12-05 LAB — LACTATE DEHYDROGENASE: LDH: 171 U/L (ref 94–250)

## 2010-01-24 LAB — URIC ACID: Uric Acid, Serum: 7.4 mg/dL (ref 4.0–7.8)

## 2010-01-24 LAB — MANUAL DIFFERENTIAL
ALC: 169 10*3/uL — ABNORMAL HIGH (ref 0.9–3.3)
ANC (CHCC manual diff): 3.4 10*3/uL (ref 1.5–6.5)
Band Neutrophils: 0 % (ref 0–10)
Basophil: 0 % (ref 0–2)
Blasts: 0 % (ref 0–0)
EOS: 0 % (ref 0–7)
LYMPH: 98 % — ABNORMAL HIGH (ref 14–49)
MONO: 0 % (ref 0–14)
Metamyelocytes: 0 % (ref 0–0)
Myelocytes: 0 % (ref 0–0)
Other Cell: 0 % (ref 0–0)
PLT EST: DECREASED
PROMYELO: 0 % (ref 0–0)
SEG: 2 % — ABNORMAL LOW (ref 38–77)
Variant Lymph: 0 % (ref 0–0)
nRBC: 0 % (ref 0–0)

## 2010-01-24 LAB — CBC WITH DIFFERENTIAL/PLATELET
HCT: 42.5 % (ref 38.4–49.9)
HGB: 14 g/dL (ref 13.0–17.1)
MCH: 29.3 pg (ref 27.2–33.4)
MCHC: 32.9 g/dL (ref 32.0–36.0)
MCV: 89.2 fL (ref 79.3–98.0)
Platelets: 112 10*3/uL — ABNORMAL LOW (ref 140–400)
RBC: 4.76 10*6/uL (ref 4.20–5.82)
RDW: 15.3 % — ABNORMAL HIGH (ref 11.0–14.6)
WBC: 172.4 10*3/uL (ref 4.0–10.3)

## 2010-01-24 LAB — COMPREHENSIVE METABOLIC PANEL
ALT: 20 U/L (ref 0–53)
AST: 25 U/L (ref 0–37)
Albumin: 4.5 g/dL (ref 3.5–5.2)
Alkaline Phosphatase: 66 U/L (ref 39–117)
BUN: 15 mg/dL (ref 6–23)
CO2: 31 mEq/L (ref 19–32)
Calcium: 9.8 mg/dL (ref 8.4–10.5)
Chloride: 103 mEq/L (ref 96–112)
Creatinine, Ser: 1.31 mg/dL (ref 0.40–1.50)
Glucose, Bld: 119 mg/dL — ABNORMAL HIGH (ref 70–99)
Potassium: 4.1 mEq/L (ref 3.5–5.3)
Sodium: 141 mEq/L (ref 135–145)
Total Bilirubin: 1.6 mg/dL — ABNORMAL HIGH (ref 0.3–1.2)
Total Protein: 7.4 g/dL (ref 6.0–8.3)

## 2010-01-24 LAB — LACTATE DEHYDROGENASE: LDH: 180 U/L (ref 94–250)

## 2010-01-24 LAB — CHCC SMEAR

## 2010-01-26 ENCOUNTER — Ambulatory Visit: Payer: Self-pay | Admitting: Oncology

## 2010-03-07 ENCOUNTER — Encounter (HOSPITAL_BASED_OUTPATIENT_CLINIC_OR_DEPARTMENT_OTHER): Payer: Medicare Other | Admitting: Oncology

## 2010-03-07 ENCOUNTER — Other Ambulatory Visit: Payer: Self-pay | Admitting: Oncology

## 2010-03-07 DIAGNOSIS — C9110 Chronic lymphocytic leukemia of B-cell type not having achieved remission: Secondary | ICD-10-CM

## 2010-03-07 DIAGNOSIS — M109 Gout, unspecified: Secondary | ICD-10-CM

## 2010-03-07 LAB — MANUAL DIFFERENTIAL
ALC: 166.8 10*3/uL — ABNORMAL HIGH (ref 0.9–3.3)
Band Neutrophils: 0 % (ref 0–10)
Blasts: 0 % (ref 0–0)
EOS: 0 % (ref 0–7)
Myelocytes: 0 % (ref 0–0)
SEG: 4 % — ABNORMAL LOW (ref 38–77)

## 2010-03-07 LAB — COMPREHENSIVE METABOLIC PANEL
ALT: 10 U/L (ref 0–53)
AST: 18 U/L (ref 0–37)
Albumin: 4.6 g/dL (ref 3.5–5.2)
Calcium: 9 mg/dL (ref 8.4–10.5)
Glucose, Bld: 133 mg/dL — ABNORMAL HIGH (ref 70–99)
Sodium: 140 mEq/L (ref 135–145)
Total Protein: 6.4 g/dL (ref 6.0–8.3)

## 2010-03-07 LAB — CBC WITH DIFFERENTIAL/PLATELET
HGB: 13 g/dL (ref 13.0–17.1)
MCH: 28.2 pg (ref 27.2–33.4)
MCV: 88.9 fL (ref 79.3–98.0)
RBC: 4.62 10*6/uL (ref 4.20–5.82)
WBC: 173.8 10*3/uL (ref 4.0–10.3)

## 2010-03-07 LAB — IGG: IgG (Immunoglobin G), Serum: 601 mg/dL — ABNORMAL LOW (ref 694–1618)

## 2010-03-07 LAB — URIC ACID: Uric Acid, Serum: 7.3 mg/dL (ref 4.0–7.8)

## 2010-03-13 ENCOUNTER — Encounter (HOSPITAL_BASED_OUTPATIENT_CLINIC_OR_DEPARTMENT_OTHER): Payer: Medicare Other | Admitting: Oncology

## 2010-03-13 DIAGNOSIS — C9110 Chronic lymphocytic leukemia of B-cell type not having achieved remission: Secondary | ICD-10-CM

## 2010-03-13 DIAGNOSIS — M109 Gout, unspecified: Secondary | ICD-10-CM

## 2010-04-24 ENCOUNTER — Other Ambulatory Visit: Payer: Self-pay | Admitting: Oncology

## 2010-04-24 ENCOUNTER — Encounter (HOSPITAL_BASED_OUTPATIENT_CLINIC_OR_DEPARTMENT_OTHER): Payer: Medicare Other | Admitting: Oncology

## 2010-04-24 DIAGNOSIS — M109 Gout, unspecified: Secondary | ICD-10-CM

## 2010-04-24 DIAGNOSIS — D649 Anemia, unspecified: Secondary | ICD-10-CM

## 2010-04-24 LAB — CBC WITH DIFFERENTIAL/PLATELET
BASO%: 0.3 % (ref 0.0–2.0)
Basophils Absolute: 0.5 10*3/uL — ABNORMAL HIGH (ref 0.0–0.1)
HGB: 12.8 g/dL — ABNORMAL LOW (ref 13.0–17.1)
LYMPH%: 88.1 % — ABNORMAL HIGH (ref 14.0–49.0)
MCV: 88.5 fL (ref 79.3–98.0)
MONO#: 1.2 10*3/uL — ABNORMAL HIGH (ref 0.1–0.9)
MONO%: 0.8 % (ref 0.0–14.0)
Platelets: 123 10*3/uL — ABNORMAL LOW (ref 140–400)
WBC: 156.5 10*3/uL (ref 4.0–10.3)
lymph#: 137.9 10*3/uL — ABNORMAL HIGH (ref 0.9–3.3)

## 2010-04-24 LAB — CHCC SMEAR

## 2010-04-24 LAB — COMPREHENSIVE METABOLIC PANEL
ALT: 9 U/L (ref 0–53)
Albumin: 4.5 g/dL (ref 3.5–5.2)
CO2: 28 mEq/L (ref 19–32)
Chloride: 105 mEq/L (ref 96–112)
Glucose, Bld: 114 mg/dL — ABNORMAL HIGH (ref 70–99)
Potassium: 4.1 mEq/L (ref 3.5–5.3)
Sodium: 142 mEq/L (ref 135–145)
Total Protein: 6.3 g/dL (ref 6.0–8.3)

## 2010-04-24 LAB — MORPHOLOGY: PLT EST: DECREASED

## 2010-04-24 LAB — LACTATE DEHYDROGENASE: LDH: 132 U/L (ref 94–250)

## 2010-04-24 LAB — URIC ACID: Uric Acid, Serum: 7.3 mg/dL (ref 4.0–7.8)

## 2010-04-29 IMAGING — CR DG CHEST 2V
2 series · 2 of 2 positions shown · non-contrast
Comparison: Chest radiograph 07/23/2004

CLINICAL DATA: Preop for right inguinal hernia

CHEST - 2 VIEW

[w chest pa]
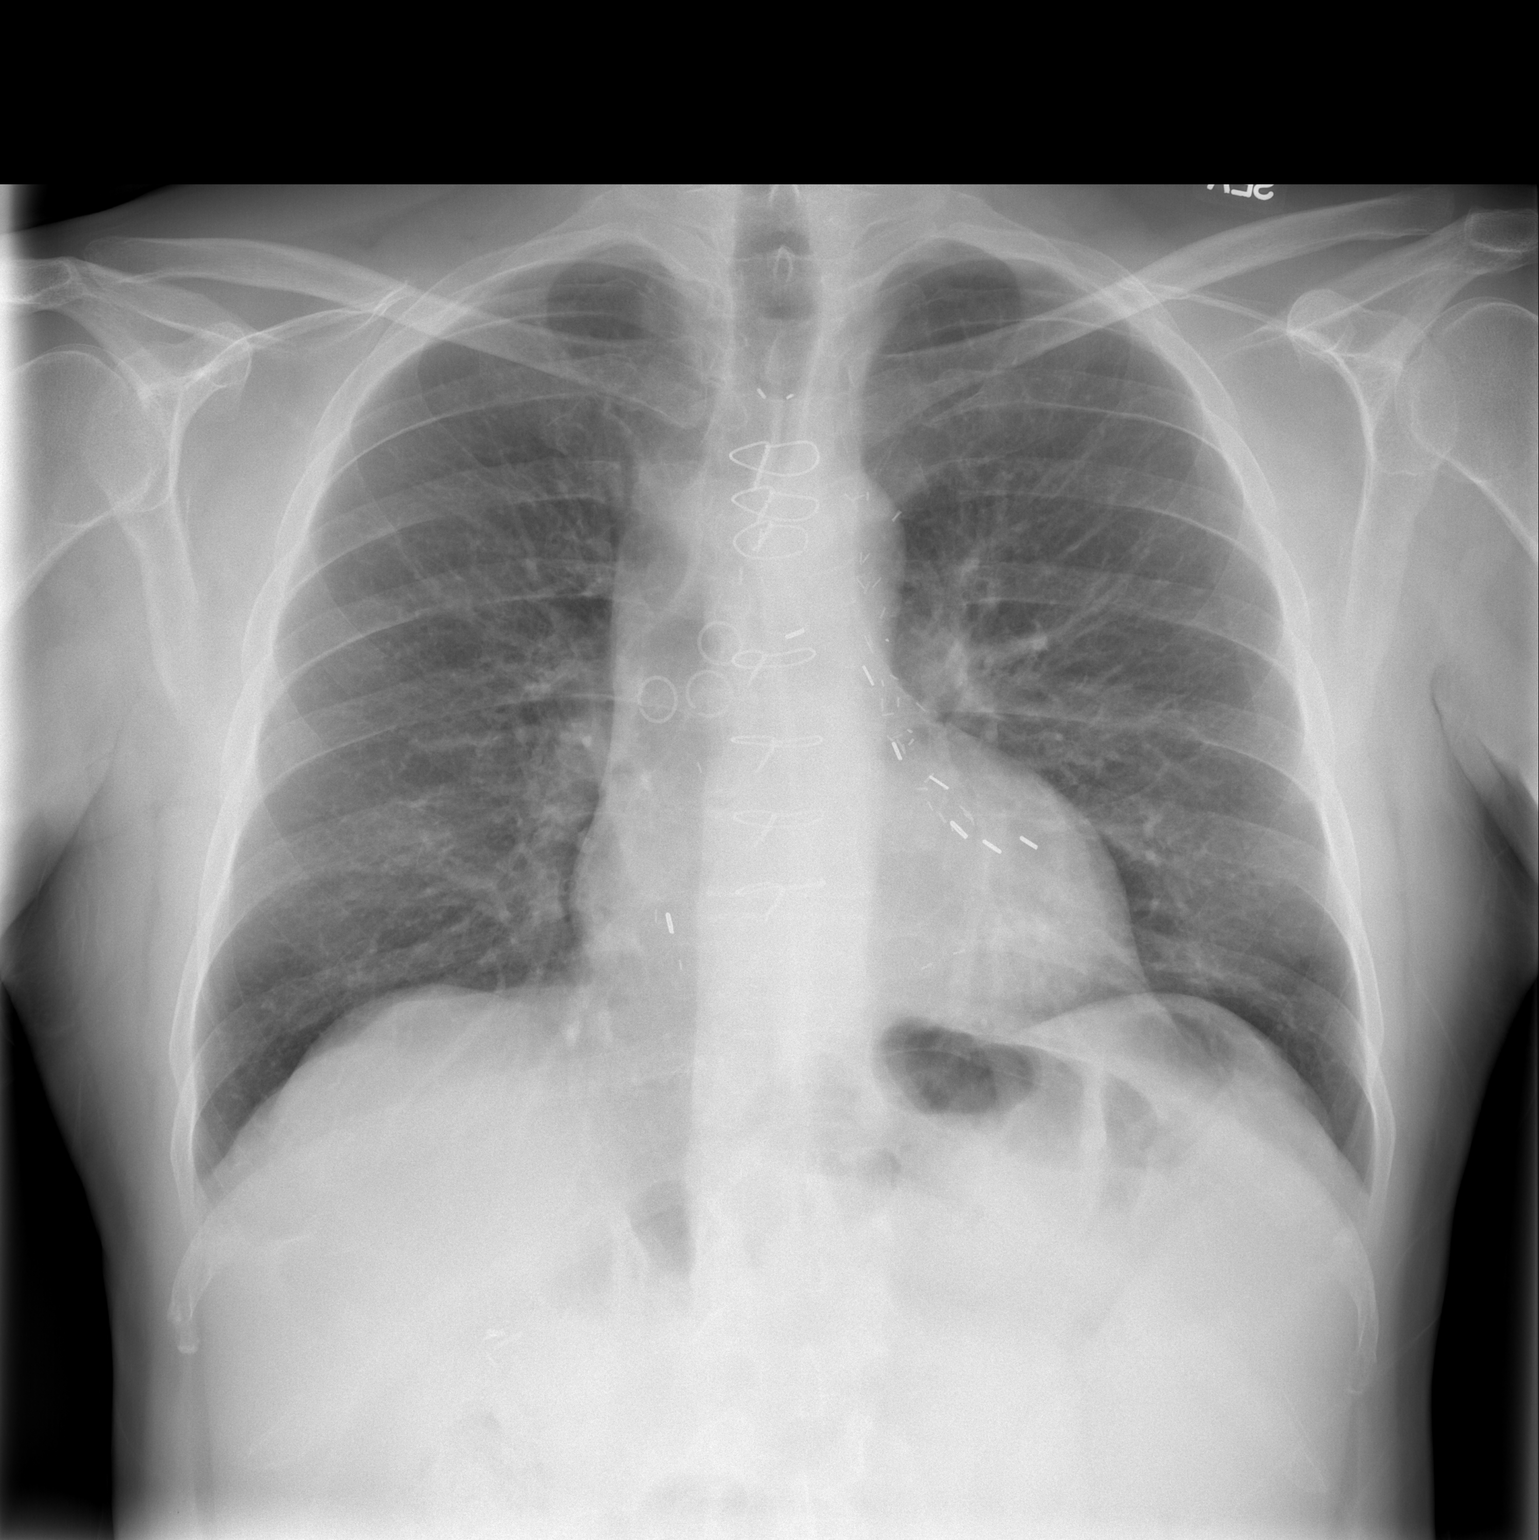

[w chest lat]
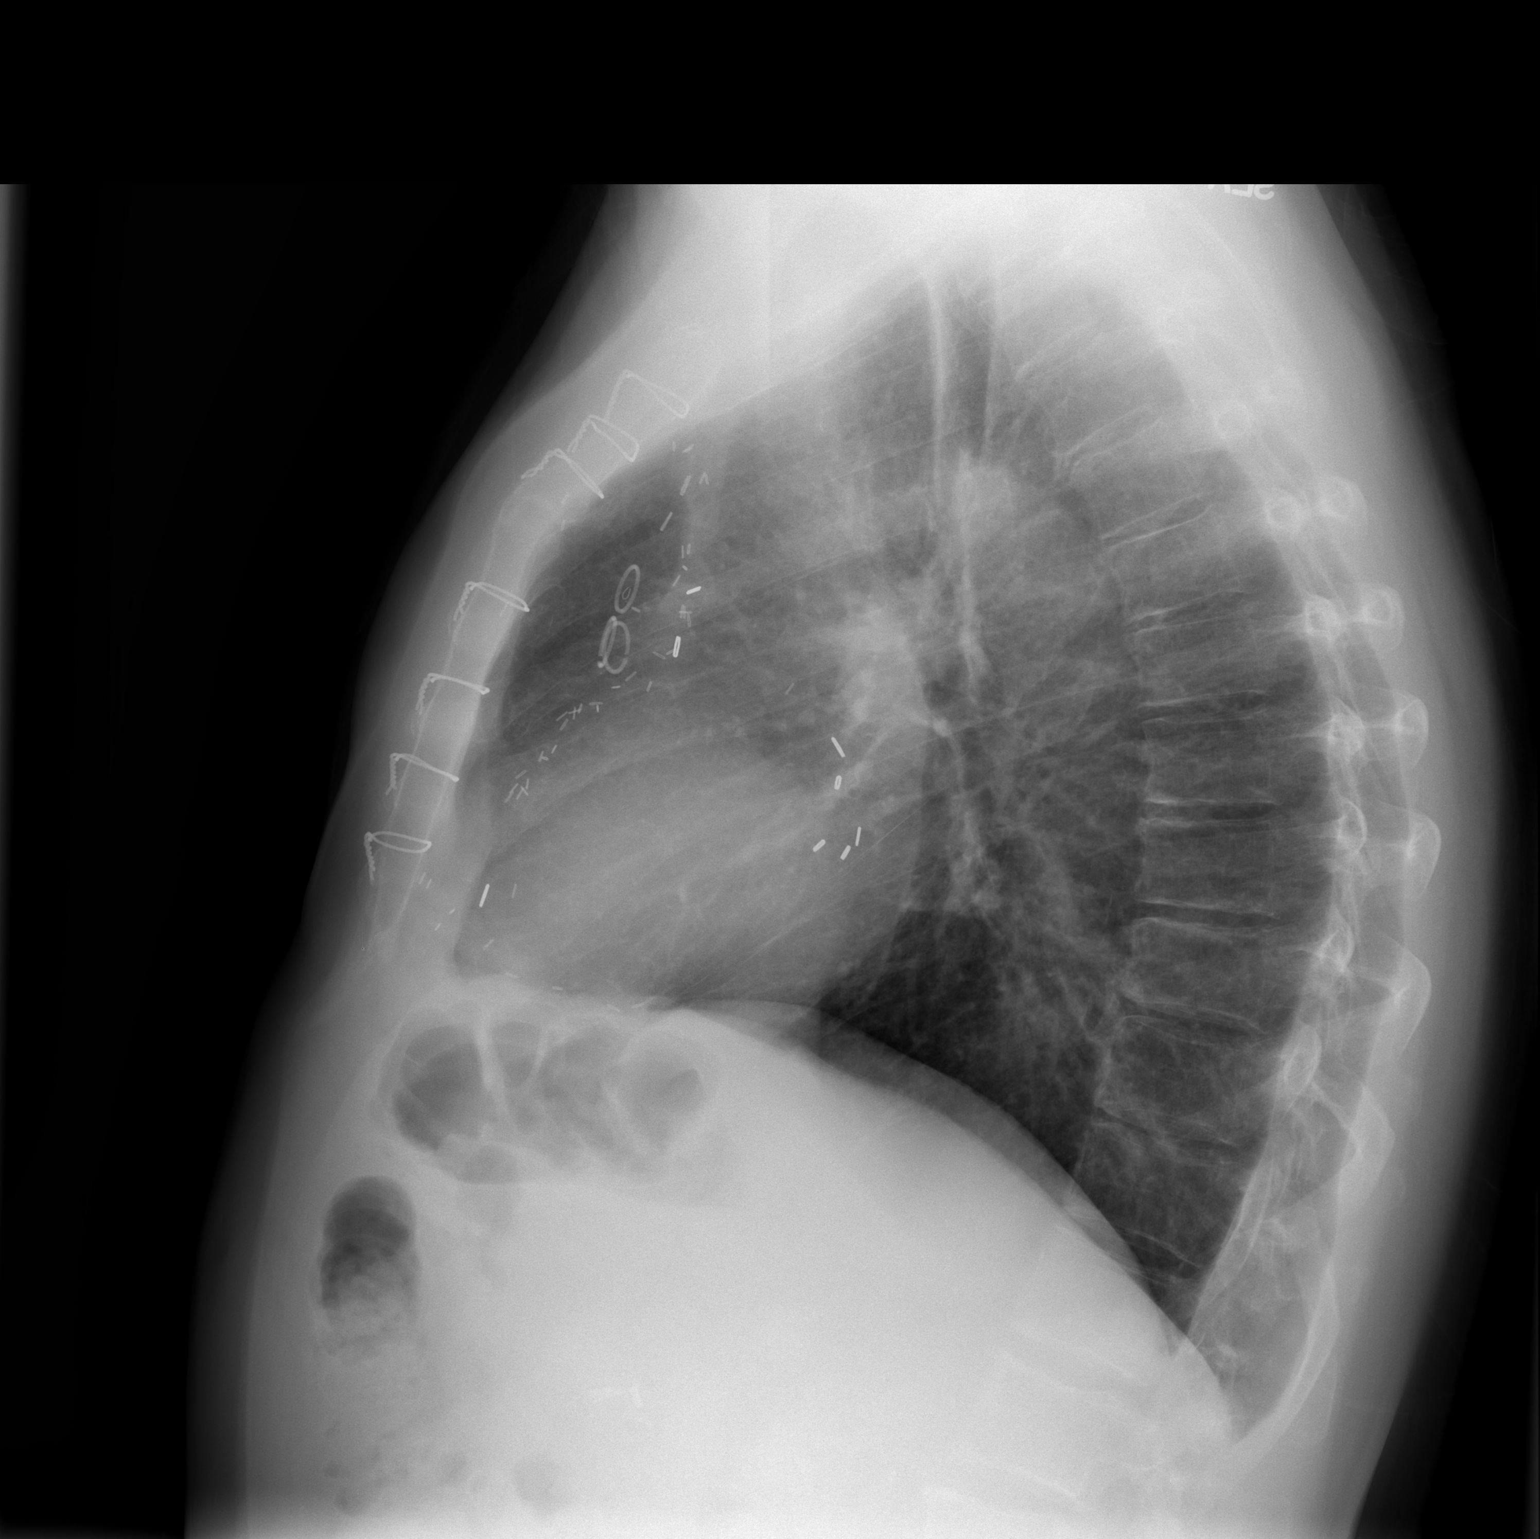

[2 of 2 positions shown; findings below may reference images not displayed]

FINDINGS: There are changes of median sternotomy for CABG.  Mild
cardiomegaly appears stable.  The lungs are clear.  There is no
pleural effusion. Cholecystectomy clips in the right upper
quadrant.  No acute osseous abnormality.
IMPRESSION: Mild cardiomegaly and changes of prior CABG.  No acute findings in
the chest.

## 2010-05-08 LAB — GLUCOSE, CAPILLARY
Glucose-Capillary: 118 mg/dL — ABNORMAL HIGH (ref 70–99)
Glucose-Capillary: 127 mg/dL — ABNORMAL HIGH (ref 70–99)

## 2010-05-08 LAB — BASIC METABOLIC PANEL
BUN: 15 mg/dL (ref 6–23)
CO2: 29 mEq/L (ref 19–32)
Chloride: 105 mEq/L (ref 96–112)
Creatinine, Ser: 1.18 mg/dL (ref 0.4–1.5)
Potassium: 5.3 mEq/L — ABNORMAL HIGH (ref 3.5–5.1)

## 2010-05-29 ENCOUNTER — Encounter (HOSPITAL_BASED_OUTPATIENT_CLINIC_OR_DEPARTMENT_OTHER): Payer: Medicare Other | Admitting: Oncology

## 2010-05-29 ENCOUNTER — Other Ambulatory Visit: Payer: Self-pay | Admitting: Oncology

## 2010-05-29 DIAGNOSIS — M109 Gout, unspecified: Secondary | ICD-10-CM

## 2010-05-29 DIAGNOSIS — C9110 Chronic lymphocytic leukemia of B-cell type not having achieved remission: Secondary | ICD-10-CM

## 2010-05-29 LAB — CBC WITH DIFFERENTIAL/PLATELET
BASO%: 0.7 % (ref 0.0–2.0)
EOS%: 0 % (ref 0.0–7.0)
HCT: 38.8 % (ref 38.4–49.9)
LYMPH%: 92.8 % — ABNORMAL HIGH (ref 14.0–49.0)
MCH: 28.8 pg (ref 27.2–33.4)
MCHC: 32.6 g/dL (ref 32.0–36.0)
MONO#: 1.4 10*3/uL — ABNORMAL HIGH (ref 0.1–0.9)
NEUT%: 5.5 % — ABNORMAL LOW (ref 39.0–75.0)
Platelets: 107 10*3/uL — ABNORMAL LOW (ref 140–400)
RBC: 4.39 10*6/uL (ref 4.20–5.82)
WBC: 143.8 10*3/uL (ref 4.0–10.3)
lymph#: 133.4 10*3/uL — ABNORMAL HIGH (ref 0.9–3.3)

## 2010-05-29 LAB — MORPHOLOGY

## 2010-05-29 LAB — CHCC SMEAR

## 2010-05-30 LAB — COMPREHENSIVE METABOLIC PANEL
Albumin: 4.5 g/dL (ref 3.5–5.2)
BUN: 16 mg/dL (ref 6–23)
CO2: 27 mEq/L (ref 19–32)
Calcium: 9.6 mg/dL (ref 8.4–10.5)
Glucose, Bld: 107 mg/dL — ABNORMAL HIGH (ref 70–99)
Potassium: 4.5 mEq/L (ref 3.5–5.3)
Sodium: 140 mEq/L (ref 135–145)
Total Protein: 6.2 g/dL (ref 6.0–8.3)

## 2010-05-30 LAB — IGG, IGA, IGM
IgA: 46 mg/dL — ABNORMAL LOW (ref 68–378)
IgG (Immunoglobin G), Serum: 571 mg/dL — ABNORMAL LOW (ref 700–1600)

## 2010-05-30 LAB — LACTATE DEHYDROGENASE: LDH: 139 U/L (ref 94–250)

## 2010-06-06 ENCOUNTER — Encounter (HOSPITAL_BASED_OUTPATIENT_CLINIC_OR_DEPARTMENT_OTHER): Payer: Medicare Other | Admitting: Oncology

## 2010-06-06 DIAGNOSIS — C9110 Chronic lymphocytic leukemia of B-cell type not having achieved remission: Secondary | ICD-10-CM

## 2010-06-06 DIAGNOSIS — M109 Gout, unspecified: Secondary | ICD-10-CM

## 2010-06-06 NOTE — Op Note (Signed)
[name], BUECHE NO.:  [id number]   MEDICAL RECORD NO.:  [id number]          PATIENT TYPE:  AMB   LOCATION:  DAY                          FACILITY:  [facility]   PHYSICIAN:  Courtney Paris, M.D.DATE OF BIRTH:  1941/01/12   DATE OF PROCEDURE:  08/29/2007  DATE OF DISCHARGE:                               OPERATIVE REPORT   PREOPERATIVE DIAGNOSIS:  Intraoperative bladder injury.   POSTOPERATIVE DIAGNOSIS:  Intraoperative bladder injury.   PROCEDURE:  Primary closure of anterior bladder injury.   ATTENDING PHYSICIAN:  Dr. Vic Blackbird.   RESIDENT PHYSICIAN:  Dr. Delman Kitten.   ANESTHESIA:  Was general.   INDICATIONS FOR PROCEDURE:  [name] is a 70 year old white male who is  undergoing right inguinal hernia repair.  Initially, this had been  planned as a laparoscopic case but was converted to an open case.  The  hernia was repaired with mesh.  However, intraoperatively, there was a  defect noted in the anterior peritoneal wall, and when explored  laparoscopically, freely opened into the preperitoneal space.  Further  exploration demonstrated a bladder laceration that was confirmed by  direct visualization of the Foley catheter bulb as well as irrigation of  the catheter with visible extravasation.  Subsequently, we were  consulted and evaluated [name] intraoperatively.  After discussion of  whether to close this primarily in laparoscopic fashion or open, it was  decided due to the location and size of the injury, an open procedure  was best.   PROCEDURE IN DETAIL:  The patient had already been then prepped and  draped sterilely.  All of the incisions from his laparoscopic/converted  open right inguinal hernia had been primarily closed, and he had a Foley  catheter in his bladder.  We made a low midline incision sharply with a  scalpel, carried it down bluntly with electrocautery to the rectus  fascia which was divided in the midline sharply  and carried cranially  and then caudally to the extent of the pubic symphysis.  The rectus  muscles were divided bluntly in the midline and separated and then blunt  dissection through the space of Retzius reveal the anterior bladder  wall.  We placed our retractor and further explored the injury.  It  appeared to be approximately 4 cm in length angled obliquely in the  anterior abdominal wall.  The most caudal aspect of the laceration near  the bladder neck but did not enter it.  Both ureteral orifices were  noted to be in their normal anatomic position and found to be effluxing  clear urine.  The injury did not come anywhere close to the bladder  outlet or to the ureteral orifices.  No other injuries were appreciated.  We subsequently let the balloon down on the catheter and backed into the  prostatic urethra to ensure that it would not be damaged during our  closure.  We subsequently closed the bladder in 2 layers; first layer  was closed with primary tension on realigning the mucosal edges, and the  second layer was an imbricating layer, oversewing  the first incision  providing strength.  Both closures were done with 3-0 chromic in running  fashion.  Once closure was complete, we placed a new Foley catheter into  the bladder and inflated the balloon with 10 mL of sterile water and  tested our anastomosis with 100 mL of normal saline.  There was no  extravasation, and the closure was watertight.  We subsequently removed  our retractor and turned our attention to our closure.  We  reapproximated the rectus muscles in the midline with 3 simple  interrupted sutures using 3-0 Vicryl.  The rectus fascia was closed with  a running suture using #1 PDS, and then multiple cysts simple  interrupted sutures were placed to reapproximate the subcutaneous layers  to eliminate any dead space, and  the skin was then closed with a running subcuticular suture using 4-0  Monocryl.  Steri-Strips were then  placed over this.  This marked the end  of our procedure.  He tolerated the procedure well.  There were no  complications.  Dr. Aldean Ast was present and participated in all  aspects of the case.      Melina Schools, MD      Courtney Paris, M.D.  Electronically Signed    JR/MEDQ  D:  08/29/2007  T:  08/29/2007  Job:  [id number]

## 2010-06-06 NOTE — Op Note (Signed)
[name], [name] NO.:  [id number]   MEDICAL RECORD NO.:  [id number]          PATIENT TYPE:  AMB   LOCATION:  DAY                          FACILITY:  [facility]   PHYSICIAN:  Lennie Muckle, MD      DATE OF BIRTH:  1940-12-04   DATE OF PROCEDURE:  08/29/2007  DATE OF DISCHARGE:                               OPERATIVE REPORT   PREOPERATIVE DIAGNOSIS:  Right inguinal hernia.   POSTOPERATIVE DIAGNOSIS:  Right inguinal hernia with inadvertent bladder  injury.   PROCEDURE:  Attempted laparoscopic inguinal hernia repair converted to  open hernia repair and repair of bladder injury by urology.   INDICATIONS FOR PROCEDURE:  [name] is a present 70 year old male who  was found to have a reducible right inguinal hernia.  He had had  apparently some symptoms since late May/early June.  He did have pain  and had increase in bulging and pain with standing.  He desired to have  repair of this.  Informed consent was obtained prior to the procedure.   DETAILS OF PROCEDURE:  [name] was identified in the preoperative  holding area.  He did receive a gram of Kefzol and was taken to the  operating room.  Once in the operating room, placed in supine position.  After administration of general endotracheal anesthesia, his abdomen and  genital area were prepped and draped in the usual sterile fashion.  A  Foley catheter was placed.  A time out proceeded and the patient and  procedure were confirmed.  An incision was placed infraumbilically after  anesthetizing the skin with 0.25% Marcaine.  I identified the anterior  rectus fascia and incised this in order to identfy the rectus muscle.  I  retracted the the right rectus muscle anteriorly and finger dissected  the preperitoneal space. I was able to easily dissect beneath the rectus  muscles.  I then placed the SpaceMaker Plus into the preperitoneal  space.  I carefully placed the trocar into the preperitoneal space until  I felt  this on the pubic bone and then I removed the inner cannula  The  balloon was then insufflated with inspection of the camera.  After  squeezing the balloon a couple more times, I noticed a small amount of  bleeding at the pelvis which was not atypical using the balloon for  dissection.  I then removed the balloon and insufflated the  preperitoneal space.  There seemed to be some gas escaping into the  peritoneal cavity as the peritoneum was insufflating into the  preperitoneal space. The peritoneum superiorly caused some visual  difficulty. However, I was able to look in the preperitoneal space in  the pelvis and place a 5-mm trocar in the midline.  There was some mild  oozing noted in the pelvis.  A suction catheter was used to evacuate  some of the blood.  I then attempted to dissect laterally to create a  plane in order to perform the laparoscopic repair.  However, the  peritoneum continued to rise into the operative field making  visualization very  difficult.  After a few more minutes at attempting to  dissect laterally but not achieving the ability to visualize the full  preperitoneal space, I aborted my laparoscopic repair.  There did not  seem to be any more bleeding in the pelvis area.  I closed the fascial  defect with a 0 Vicryl suture.   An Collier Flowers was placed on the abdominal wall. I identified the anatomic  landmarks of the pubis bone and the anterior iliac spine on the right.  I placed an incision along the area of the ilioinguinal ligament.  The  Scarpa's fascia was divided with electrocautery.  I identified the  external oblique fascia.  This was incised with a #10 blade.  I then  extended the incision with Metzenbaum scissors down to the external ring  and extended proximally.  I finger dissected the external oblique to  identify the ilioinguinal ligament and then dissected medially to the  internal oblique and transversalis muscle.  I was able to bluntly  dissect the  spermatic cord and vessels at the pubic tubercle.  I encased  a Penrose drain around the spermatic cord and vessels.  The hernia sac  was then dissected away from the spermatic cord and vessels.  At that  time, it was noted by anesthesiology there was blood in the urine.  It  did appear to be bright red.  As I opened the hernia sac for inspection  of contents, I noted blood-tinged fluid from the abdomen.  This was not  bright red and seems more serous in nature.  I ligated the hernia sac  with a 3.0 silk suture and allow this to retract back into the abdominal  cavity.  I then placed a 3 x 6 piece of polypropylene mesh into the  defect space and secured this with a 2-0 Prolene suture on both sides.  Laterally this was secured to the shelving edge of the ilioinguinal  ligament.  The medial portion was secured to the transverse muscle and  the internal oblique muscle.  Tails were overlapped around the spermatic  cord vessels to create a new internal ring.  I was able to place the  pinky finger through the defect at the internal ring.  The wound bed was  irrigated.  There was no evidence of bleeding at the end of this portion  of the case.  Therefore, I closed the external oblique fascia with a  running 3-0 Vicryl suture.  Scarpa was reapproximated with 3-0 Vicryl,  and the dermis was closed with 0 Vicryl.  I closed the skin was a  running 4-0 Monocryl.   Due to the blood in the urine, I was concerned for a bladder injury. I  then placed a 5-mm trocar at the midline using the OptiVu.  All layers  were visualized upon entry into the abdominal cavity.  There was no  evidence of injury opon placement of this trocar.  I was then able to  place another 5 mm trocar superiorly using my previous incision. Using a  suction irrigator, I inspected the abdominal cavity and the  preperitoneal space.  There was old blood noted in the pelvis.  A defect  in the peritoneum was seen near the umbilicus which  is what precluded me  from performing the laparosopic repair.  The peritoeum was fully intact  distally in the pelvis. No bleeding was found in the abdominal cavity.  I then inspected the preperitoneal space by nagivating the camera  through  the peritoneal defect.  Clot was noted in the pubic area but no  acute bleeding was noted.  Methelene blue dye was instilled via the  foley catheter to see if indeed there was an injury.  Blue colored fluid  was noted to be coming from the preperitonal space. Upon further  inspection, I did see a small hole within the bladder.  Urology was  consulted for advice and/or repair. Dr. Aldean Ast was able to provide  help during this case. Please see his dictation for full OP note on  repair of the bladder.  I released pneumoperitoneaum and removed the  trocars.  The skin was closed with 4-0 Monocryl.  Steri-Strips were  placed for final dressing.  The patient was extubated and taken to post-  anesthesia care unit in stable condition.  He will be discharged home on  Cipro, Percocet and ibuprofen for pain.  He will follow up with Dr.  Aldean Ast for further evaluation of possible Foley removal.  He will see  me in approximately 1-2 weeks.  I will have him not lift any more than  20 pounds for 6 weeks and will call if he develops any fevers or chills  or erythema at the incision site.      Lennie Muckle, MD  Electronically Signed     ALA/MEDQ  D:  08/29/2007  T:  08/29/2007  Job:  [id number]   cc:   A. Tenny Craw, M.D.

## 2010-06-06 NOTE — Op Note (Signed)
NAMEAYYAN, SITES NO.:  [id number]   MEDICAL RECORD NO.:  [id number]          PATIENT TYPE:  AMB   LOCATION:  DSC                          FACILITY:  [facility]   PHYSICIAN:  Cindee Salt, M.D.       DATE OF BIRTH:  05-Sep-1940   DATE OF PROCEDURE:  02/03/2008  DATE OF DISCHARGE:                               OPERATIVE REPORT   PREOPERATIVE DIAGNOSIS:  Stenosing tenosynovitis, right middle finger.   POSTOPERATIVE DIAGNOSIS:  Stenosing tenosynovitis, right middle finger.   OPERATION:  Release A1 pulley, right middle finger.   SURGEON:  Cindee Salt, MD   ASSISTANT:  Carolyne Fiscal, RN   ANESTHESIA:  Forearm-based IV regional.   ANESTHESIOLOGIST:  Burna Forts, MD.   HISTORY:  The patient is a 70 year old male with a history of triggering  of his right middle finger but not responsive to conservative treatment.  He does have a history of diabetes.  He is desirous of release of the A1  pulley for relief of symptoms.  He is aware that there is no guarantee  with the surgery; possibility of infection; recurrence; injury to  arteries, nerves, and tendons; incomplete relief of symptoms; and  dystrophy.  In the preoperative area, the patient is seen, the extremity  marked by both the patient and surgeon, antibiotic given.   PROCEDURE:  The patient is brought to the operating room where a forearm-  based IV regional anesthetic was carried out without difficulty.  He was  prepped using DuraPrep in supine position with the right arm free.  A  time-out was taken.  An oblique incision was made over the A1 pulley of  the right middle finger, carried down through subcutaneous tissue.  He  continued to have some feeling.  Local anesthetic was given with 1%  Xylocaine without epinephrine.  The A1 pulley was identified.  This was  released in its radial aspect.  A very significant tenosynovitis was  present.  This was debrided.  This appeared to be an adherent  tenosynovitis.  The  finger placed through a full range of motion.  No  further triggering was noted.  The wound was copiously irrigated with  saline.  The skin closed with interrupted 5-0 Vicryl Rapide sutures.  A  sterile compressive dressing was applied to the hand leaving the fingers  free.  The patient tolerated the procedure well and was taken to the  recovery room for observation in satisfactory condition.  He will be  discharged home, to return to the [facility] of [place] in 1 week,  on Vicodin.          ______________________________  Cindee Salt, M.D.    GK/MEDQ  D:  02/03/2008  T:  02/03/2008  Job:  [id number]   cc:   Dr. Tenny Craw

## 2010-08-08 ENCOUNTER — Other Ambulatory Visit: Payer: Self-pay | Admitting: Oncology

## 2010-08-08 ENCOUNTER — Encounter (HOSPITAL_BASED_OUTPATIENT_CLINIC_OR_DEPARTMENT_OTHER): Payer: Medicare Other | Admitting: Oncology

## 2010-08-08 DIAGNOSIS — C9110 Chronic lymphocytic leukemia of B-cell type not having achieved remission: Secondary | ICD-10-CM

## 2010-08-08 DIAGNOSIS — M109 Gout, unspecified: Secondary | ICD-10-CM

## 2010-08-08 LAB — MANUAL DIFFERENTIAL
Basophil: 0 % (ref 0–2)
EOS: 0 % (ref 0–7)
MONO: 2 % (ref 0–14)
Myelocytes: 0 % (ref 0–0)
Other Cell: 0 % (ref 0–0)
PLT EST: DECREASED
SEG: 2 % — ABNORMAL LOW (ref 38–77)

## 2010-08-08 LAB — CHCC SMEAR

## 2010-08-08 LAB — CBC WITH DIFFERENTIAL/PLATELET
MCH: 28.3 pg (ref 27.2–33.4)
MCHC: 31.4 g/dL — ABNORMAL LOW (ref 32.0–36.0)
MCV: 90 fL (ref 79.3–98.0)
RBC: 4.68 10*6/uL (ref 4.20–5.82)
RDW: 16.5 % — ABNORMAL HIGH (ref 11.0–14.6)

## 2010-08-09 LAB — COMPREHENSIVE METABOLIC PANEL
ALT: 12 U/L (ref 0–53)
Alkaline Phosphatase: 70 U/L (ref 39–117)
CO2: 24 mEq/L (ref 19–32)
Creatinine, Ser: 1.4 mg/dL — ABNORMAL HIGH (ref 0.50–1.35)
Glucose, Bld: 115 mg/dL — ABNORMAL HIGH (ref 70–99)
Total Bilirubin: 1.5 mg/dL — ABNORMAL HIGH (ref 0.3–1.2)

## 2010-08-09 LAB — URIC ACID: Uric Acid, Serum: 8.6 mg/dL — ABNORMAL HIGH (ref 4.0–7.8)

## 2010-08-09 LAB — LACTATE DEHYDROGENASE: LDH: 152 U/L (ref 94–250)

## 2010-08-09 LAB — IGG, IGA, IGM: IgG (Immunoglobin G), Serum: 662 mg/dL (ref 650–1600)

## 2010-10-03 ENCOUNTER — Encounter (HOSPITAL_BASED_OUTPATIENT_CLINIC_OR_DEPARTMENT_OTHER): Payer: Medicare Other | Admitting: Oncology

## 2010-10-03 ENCOUNTER — Other Ambulatory Visit: Payer: Self-pay | Admitting: Oncology

## 2010-10-03 DIAGNOSIS — C9110 Chronic lymphocytic leukemia of B-cell type not having achieved remission: Secondary | ICD-10-CM

## 2010-10-03 DIAGNOSIS — M109 Gout, unspecified: Secondary | ICD-10-CM

## 2010-10-03 LAB — MANUAL DIFFERENTIAL
ALC: 179.7 10*3/uL — ABNORMAL HIGH (ref 0.9–3.3)
ANC (CHCC manual diff): 9.5 10*3/uL — ABNORMAL HIGH (ref 1.5–6.5)
Basophil: 0 % (ref 0–2)
Blasts: 0 % (ref 0–0)
Metamyelocytes: 0 % (ref 0–0)
Myelocytes: 0 % (ref 0–0)
PLT EST: DECREASED
PROMYELO: 0 % (ref 0–0)
Variant Lymph: 0 % (ref 0–0)

## 2010-10-03 LAB — CBC WITH DIFFERENTIAL/PLATELET
MCHC: 32.1 g/dL (ref 32.0–36.0)
Platelets: 136 10*3/uL — ABNORMAL LOW (ref 140–400)
RBC: 4.68 10*6/uL (ref 4.20–5.82)
WBC: 189.2 10*3/uL (ref 4.0–10.3)

## 2010-10-03 LAB — CHCC SMEAR

## 2010-10-04 LAB — COMPREHENSIVE METABOLIC PANEL
ALT: 8 U/L (ref 0–53)
AST: 15 U/L (ref 0–37)
Albumin: 4.6 g/dL (ref 3.5–5.2)
BUN: 16 mg/dL (ref 6–23)
CO2: 25 mEq/L (ref 19–32)
Calcium: 10 mg/dL (ref 8.4–10.5)
Chloride: 105 mEq/L (ref 96–112)
Potassium: 4.3 mEq/L (ref 3.5–5.3)

## 2010-10-04 LAB — BETA 2 MICROGLOBULIN, SERUM: Beta-2 Microglobulin: 2.64 mg/L — ABNORMAL HIGH (ref 1.01–1.73)

## 2010-10-16 ENCOUNTER — Encounter (HOSPITAL_BASED_OUTPATIENT_CLINIC_OR_DEPARTMENT_OTHER): Payer: Medicare Other | Admitting: Oncology

## 2010-10-16 DIAGNOSIS — F430 Acute stress reaction: Secondary | ICD-10-CM

## 2010-10-16 DIAGNOSIS — C9110 Chronic lymphocytic leukemia of B-cell type not having achieved remission: Secondary | ICD-10-CM

## 2010-10-20 LAB — DIFFERENTIAL
Basophils Relative: 1
Eosinophils Relative: 0
Lymphs Abs: 112.8 — ABNORMAL HIGH
Monocytes Relative: 5
Neutro Abs: 7.7

## 2010-10-20 LAB — CBC
HCT: 41.9
Hemoglobin: 13.6
MCHC: 32.6
MCV: 89.8
RBC: 4.66
WBC: 128.2

## 2010-10-20 LAB — GLUCOSE, CAPILLARY: Glucose-Capillary: 208 — ABNORMAL HIGH

## 2010-10-20 LAB — BASIC METABOLIC PANEL
CO2: 26
Chloride: 110
GFR calc Af Amer: >60
Potassium: 4
Sodium: 142

## 2010-10-20 LAB — PATHOLOGIST SMEAR REVIEW

## 2010-12-15 ENCOUNTER — Other Ambulatory Visit: Payer: Medicare Other | Admitting: Lab

## 2010-12-22 ENCOUNTER — Telehealth: Payer: Self-pay | Admitting: Oncology

## 2010-12-22 ENCOUNTER — Ambulatory Visit (HOSPITAL_BASED_OUTPATIENT_CLINIC_OR_DEPARTMENT_OTHER): Payer: Medicare Other

## 2010-12-22 ENCOUNTER — Encounter: Payer: Self-pay | Admitting: Physician Assistant

## 2010-12-22 ENCOUNTER — Other Ambulatory Visit: Payer: Self-pay | Admitting: Physician Assistant

## 2010-12-22 ENCOUNTER — Telehealth: Payer: Self-pay | Admitting: *Deleted

## 2010-12-22 DIAGNOSIS — D649 Anemia, unspecified: Secondary | ICD-10-CM

## 2010-12-22 DIAGNOSIS — C9110 Chronic lymphocytic leukemia of B-cell type not having achieved remission: Secondary | ICD-10-CM

## 2010-12-22 DIAGNOSIS — M109 Gout, unspecified: Secondary | ICD-10-CM

## 2010-12-22 HISTORY — DX: Gout, unspecified: M10.9

## 2010-12-22 HISTORY — DX: Chronic lymphocytic leukemia of B-cell type not having achieved remission: C91.10

## 2010-12-22 LAB — MANUAL DIFFERENTIAL
Band Neutrophils: 0 % (ref 0–10)
Basophil: 0 % (ref 0–2)
EOS: 0 % (ref 0–7)
LYMPH: 98 % — ABNORMAL HIGH (ref 14–49)
MONO: 1 % (ref 0–14)
Metamyelocytes: 0 % (ref 0–0)
nRBC: 0 % (ref 0–0)

## 2010-12-22 LAB — COMPREHENSIVE METABOLIC PANEL
ALT: 26 U/L (ref 0–53)
AST: 32 U/L (ref 0–37)
Creatinine, Ser: 1.25 mg/dL (ref 0.50–1.35)
Sodium: 139 mEq/L (ref 135–145)
Total Bilirubin: 1 mg/dL (ref 0.3–1.2)
Total Protein: 6.9 g/dL (ref 6.0–8.3)

## 2010-12-22 LAB — CBC WITH DIFFERENTIAL/PLATELET
HGB: 13.2 g/dL (ref 13.0–17.1)
MCV: 90.6 fL (ref 79.3–98.0)
Platelets: 120 10*3/uL — ABNORMAL LOW (ref 140–400)
RBC: 4.63 10*6/uL (ref 4.20–5.82)
RDW: 16.3 % — ABNORMAL HIGH (ref 11.0–14.6)
WBC: 169.1 10*3/uL (ref 4.0–10.3)

## 2010-12-22 LAB — LACTATE DEHYDROGENASE: LDH: 243 U/L (ref 94–250)

## 2010-12-22 LAB — URIC ACID: Uric Acid, Serum: 8.4 mg/dL — ABNORMAL HIGH (ref 4.0–7.8)

## 2010-12-22 NOTE — Telephone Encounter (Signed)
called pt and informed him to come by scheduling to p/u appts for jan2013

## 2010-12-26 ENCOUNTER — Telehealth: Payer: Self-pay | Admitting: *Deleted

## 2010-12-26 DIAGNOSIS — E790 Hyperuricemia without signs of inflammatory arthritis and tophaceous disease: Secondary | ICD-10-CM

## 2010-12-26 DIAGNOSIS — C9110 Chronic lymphocytic leukemia of B-cell type not having achieved remission: Secondary | ICD-10-CM

## 2010-12-26 NOTE — Telephone Encounter (Signed)
Per MD review of recent labs prescription for allopurinol called into pharmacy.  Spoke with pt's wife.

## 2011-01-30 ENCOUNTER — Other Ambulatory Visit (HOSPITAL_BASED_OUTPATIENT_CLINIC_OR_DEPARTMENT_OTHER): Payer: Medicare Other | Admitting: Lab

## 2011-01-30 DIAGNOSIS — C9110 Chronic lymphocytic leukemia of B-cell type not having achieved remission: Secondary | ICD-10-CM

## 2011-01-30 DIAGNOSIS — M109 Gout, unspecified: Secondary | ICD-10-CM

## 2011-01-30 LAB — CBC WITH DIFFERENTIAL/PLATELET
BASO%: 0.4 % (ref 0.0–2.0)
Basophils Absolute: 0.7 10*3/uL — ABNORMAL HIGH (ref 0.0–0.1)
EOS%: 0 % (ref 0.0–7.0)
HGB: 13.1 g/dL (ref 13.0–17.1)
MCH: 28.2 pg (ref 27.2–33.4)
MCHC: 31.5 g/dL — ABNORMAL LOW (ref 32.0–36.0)
MCV: 89.3 fL (ref 79.3–98.0)
MONO%: 3.1 % (ref 0.0–14.0)
RBC: 4.64 10*6/uL (ref 4.20–5.82)
RDW: 15.5 % — ABNORMAL HIGH (ref 11.0–14.6)

## 2011-01-30 LAB — CHCC SMEAR

## 2011-01-30 LAB — MORPHOLOGY

## 2011-01-31 LAB — COMPREHENSIVE METABOLIC PANEL
ALT: 12 U/L (ref 0–53)
CO2: 21 mEq/L (ref 19–32)
Calcium: 9.4 mg/dL (ref 8.4–10.5)
Chloride: 102 mEq/L (ref 96–112)
Creatinine, Ser: 1.34 mg/dL (ref 0.50–1.35)
Total Protein: 6.5 g/dL (ref 6.0–8.3)

## 2011-01-31 LAB — IGG, IGA, IGM: IgG (Immunoglobin G), Serum: 609 mg/dL — ABNORMAL LOW (ref 650–1600)

## 2011-01-31 LAB — LACTATE DEHYDROGENASE: LDH: 151 U/L (ref 94–250)

## 2011-01-31 NOTE — Progress Notes (Signed)
Quick Note:    For your review.  ______

## 2011-02-05 ENCOUNTER — Encounter: Payer: Self-pay | Admitting: *Deleted

## 2011-02-06 ENCOUNTER — Ambulatory Visit (HOSPITAL_BASED_OUTPATIENT_CLINIC_OR_DEPARTMENT_OTHER): Payer: Medicare Other | Admitting: Oncology

## 2011-02-06 VITALS — BP 128/76 | HR 65 | Temp 97.9°F | Ht 65.0 in | Wt 159.0 lb

## 2011-02-06 DIAGNOSIS — C9110 Chronic lymphocytic leukemia of B-cell type not having achieved remission: Secondary | ICD-10-CM

## 2011-02-06 DIAGNOSIS — R7989 Other specified abnormal findings of blood chemistry: Secondary | ICD-10-CM

## 2011-02-06 NOTE — Progress Notes (Signed)
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 He had a history is stable. Unfortunately his wife [name] continues to have severe pelvic pain worse with defecation, which has resisted so far all attempts at diagnosis and treatment. They have an appointment within neuro surgeon at [facility] coming up. Amari son in [place] continues to be dependent and the third son prognosis another son died at the age of 42) remains relatively alien aided, although otherwise stable and successful. Gym and is under a lot of stress. He does in-line skating for relaxation.  ROS:  He has a lot of energy, denies fever, rash, bleeding, pain, swelling, weight loss, or any other symptoms suggestive of progression of his disease. A detailed review of systems was otherwise noncontributory  Allergies no known allergies  Current Outpatient Prescriptions  Medication Sig Dispense Refill  . ALPRAZolam (XANAX) 1 MG tablet Take 1 mg by mouth at bedtime as needed.      Marland Kitchen atorvastatin (LIPITOR) 40 MG tablet Take 40 mg by mouth daily.      . ferrous fumarate (HEMOCYTE - 106 MG FE) 325 (106 FE) MG TABS Take 1 tablet by mouth.      . metFORMIN (GLUCOPHAGE) 500 MG tablet Take 500 mg by mouth 2 (two) times daily with a meal.      . metoprolol succinate (TOPROL-XL) 50 MG 24 hr tablet Take 50 mg by mouth daily. Take with or immediately following a meal.      . nateglinide (STARLIX) 60 MG tablet Take 60 mg by mouth 3 (three) times daily before meals.      . quinapril (ACCUPRIL) 10 MG tablet Take 10 mg by mouth at bedtime.         Objective:  Filed Vitals:   02/06/11 1215  BP: 128/76  Pulse: 65  Temp: 97.9 F (36.6 C)    BMI: Body mass index is 26.46 kg/(m^2).   ECOG FS:  Physical Exam:   Sclerae unicteric  Oropharynx clear  No cervical, supraclavicular, or axillary adenopathy  Lungs clear -- no rales or rhonchi  Heart regular  rate and rhythm  Abdomen benign, mild splenomegaly  MSK no focal spinal tenderness, no peripheral edema  Neuro nonfocal    Lab Results:      Chemistry      Component Value Date/Time   NA 143 01/30/2011 1442   K 4.1 01/30/2011 1442   CL 102 01/30/2011 1442   CO2 21 01/30/2011 1442   BUN 17 01/30/2011 1442   CREATININE 1.34 01/30/2011 1442      Component Value Date/Time   CALCIUM 9.4 01/30/2011 1442   ALKPHOS 81 01/30/2011 1442   AST 19 01/30/2011 1442   ALT 12 01/30/2011 1442   BILITOT 1.3* 01/30/2011 1442       Lab Results  Component Value Date   WBC 164.1* 01/30/2011   HGB 13.1 01/30/2011   HCT 41.4 01/30/2011   MCV 89.3 01/30/2011   PLT 117* 01/30/2011   NEUTROABS 17.0* 01/30/2011    Studies/Results:  No new results found.  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   Plan: He is agreeable to taking allopurinol, as his uric acid is  now 8.6, and he "can't afford" to be sick, since he needs to take care of [name]. We're going to continue to check his lab work on an every 2 month basis, and we will move his visits out to every 6 months. Of course he knows to call if a problem develops before the next visit.   MAGRINAT,GUSTAV C 02/06/2011     Receive.

## 2011-04-06 ENCOUNTER — Other Ambulatory Visit (HOSPITAL_BASED_OUTPATIENT_CLINIC_OR_DEPARTMENT_OTHER): Payer: Medicare Other | Admitting: Lab

## 2011-04-06 DIAGNOSIS — C9110 Chronic lymphocytic leukemia of B-cell type not having achieved remission: Secondary | ICD-10-CM

## 2011-04-06 LAB — CBC WITH DIFFERENTIAL/PLATELET
Basophils Absolute: 0.6 10*3/uL — ABNORMAL HIGH (ref 0.0–0.1)
EOS%: 0 % (ref 0.0–7.0)
Eosinophils Absolute: 0 10*3/uL (ref 0.0–0.5)
HGB: 13.5 g/dL (ref 13.0–17.1)
LYMPH%: 94.8 % — ABNORMAL HIGH (ref 14.0–49.0)
MCH: 28.7 pg (ref 27.2–33.4)
MCV: 89.1 fL (ref 79.3–98.0)
MONO%: 1.7 % (ref 0.0–14.0)
NEUT#: 4.6 10*3/uL (ref 1.5–6.5)
Platelets: 126 10*3/uL — ABNORMAL LOW (ref 140–400)
RBC: 4.7 10*6/uL (ref 4.20–5.82)

## 2011-04-07 LAB — COMPREHENSIVE METABOLIC PANEL
ALT: 10 U/L (ref 0–53)
AST: 16 U/L (ref 0–37)
Albumin: 4.6 g/dL (ref 3.5–5.2)
CO2: 27 mEq/L (ref 19–32)
Calcium: 9.1 mg/dL (ref 8.4–10.5)
Chloride: 107 mEq/L (ref 96–112)
Potassium: 4.6 mEq/L (ref 3.5–5.3)

## 2011-04-07 LAB — LACTATE DEHYDROGENASE: LDH: 169 U/L (ref 94–250)

## 2011-06-06 ENCOUNTER — Other Ambulatory Visit: Payer: Medicare Other | Admitting: Lab

## 2011-06-07 ENCOUNTER — Other Ambulatory Visit (HOSPITAL_BASED_OUTPATIENT_CLINIC_OR_DEPARTMENT_OTHER): Payer: Medicare Other | Admitting: Lab

## 2011-06-07 DIAGNOSIS — C9110 Chronic lymphocytic leukemia of B-cell type not having achieved remission: Secondary | ICD-10-CM

## 2011-06-07 DIAGNOSIS — D649 Anemia, unspecified: Secondary | ICD-10-CM

## 2011-06-07 LAB — CBC WITH DIFFERENTIAL/PLATELET
Basophils Absolute: 0.2 10*3/uL — ABNORMAL HIGH (ref 0.0–0.1)
Eosinophils Absolute: 0.2 10*3/uL (ref 0.0–0.5)
HCT: 38.6 % (ref 38.4–49.9)
HGB: 12.3 g/dL — ABNORMAL LOW (ref 13.0–17.1)
LYMPH%: 93 % — ABNORMAL HIGH (ref 14.0–49.0)
MCV: 88.2 fL (ref 79.3–98.0)
MONO#: 2.7 10*3/uL — ABNORMAL HIGH (ref 0.1–0.9)
NEUT#: 6.5 10*3/uL (ref 1.5–6.5)
NEUT%: 4.7 % — ABNORMAL LOW (ref 39.0–75.0)
Platelets: 117 10*3/uL — ABNORMAL LOW (ref 140–400)
WBC: 138.4 10*3/uL (ref 4.0–10.3)
nRBC: 0 % (ref 0–0)

## 2011-06-08 LAB — URIC ACID: Uric Acid, Serum: 4.9 mg/dL (ref 4.0–7.8)

## 2011-06-08 LAB — COMPREHENSIVE METABOLIC PANEL
ALT: 9 U/L (ref 0–53)
BUN: 16 mg/dL (ref 6–23)
CO2: 27 mEq/L (ref 19–32)
Calcium: 8.8 mg/dL (ref 8.4–10.5)
Chloride: 105 mEq/L (ref 96–112)
Creatinine, Ser: 1.25 mg/dL (ref 0.50–1.35)
Glucose, Bld: 147 mg/dL — ABNORMAL HIGH (ref 70–99)

## 2011-06-08 LAB — LACTATE DEHYDROGENASE: LDH: 142 U/L (ref 94–250)

## 2011-07-02 ENCOUNTER — Other Ambulatory Visit: Payer: Medicare Other | Admitting: Lab

## 2011-07-09 ENCOUNTER — Ambulatory Visit: Payer: Medicare Other | Admitting: Oncology

## 2011-07-30 ENCOUNTER — Other Ambulatory Visit (HOSPITAL_BASED_OUTPATIENT_CLINIC_OR_DEPARTMENT_OTHER): Payer: Medicare Other | Admitting: Lab

## 2011-07-30 DIAGNOSIS — C9110 Chronic lymphocytic leukemia of B-cell type not having achieved remission: Secondary | ICD-10-CM

## 2011-07-30 LAB — COMPREHENSIVE METABOLIC PANEL
AST: 17 U/L (ref 0–37)
Albumin: 4.3 g/dL (ref 3.5–5.2)
Alkaline Phosphatase: 70 U/L (ref 39–117)
Potassium: 4.1 mEq/L (ref 3.5–5.3)
Sodium: 141 mEq/L (ref 135–145)
Total Protein: 6 g/dL (ref 6.0–8.3)

## 2011-07-30 LAB — CBC WITH DIFFERENTIAL/PLATELET
BASO%: 0.2 % (ref 0.0–2.0)
EOS%: 0.2 % (ref 0.0–7.0)
MCH: 28.4 pg (ref 27.2–33.4)
MCV: 86.7 fL (ref 79.3–98.0)
MONO%: 2.8 % (ref 0.0–14.0)
NEUT#: 7.7 10*3/uL — ABNORMAL HIGH (ref 1.5–6.5)
RBC: 4.2 10*6/uL (ref 4.20–5.82)
RDW: 16.4 % — ABNORMAL HIGH (ref 11.0–14.6)

## 2011-07-30 LAB — IGG, IGA, IGM: IgA: 47 mg/dL — ABNORMAL LOW (ref 68–379)

## 2011-07-30 LAB — TECHNOLOGIST REVIEW

## 2011-08-06 ENCOUNTER — Telehealth: Payer: Self-pay | Admitting: Oncology

## 2011-08-06 ENCOUNTER — Ambulatory Visit (HOSPITAL_BASED_OUTPATIENT_CLINIC_OR_DEPARTMENT_OTHER): Payer: Medicare Other | Admitting: Oncology

## 2011-08-06 VITALS — BP 110/72 | HR 66 | Temp 98.3°F | Ht 65.0 in | Wt 157.9 lb

## 2011-08-06 DIAGNOSIS — C9110 Chronic lymphocytic leukemia of B-cell type not having achieved remission: Secondary | ICD-10-CM

## 2011-08-06 MED ORDER — ALLOPURINOL 300 MG PO TABS: 300.0000 mg | ORAL_TABLET | Freq: Every day | ORAL | Status: DC

## 2011-08-06 NOTE — Progress Notes (Signed)
ID: [name]   DOB: August 05, 1940  MR#: [id number]  WUJ#:[id number]  HISTORY OF PRESENT ILLNESS: The patient had had multiple liver function tests and electrolytes, but no complete blood count since 1998 (that one apparently was normal) until he was referred to [facility] for evaluation of muscle aches, possibly related to his cholesterol-lowering medication.  As part of the workup, Dr. Coral Spikes obtained a complete blood count on Jun 15, 2002, which showed a hemoglobin of 13.3, MCV 79.5, platelets 178,000 and a white cell count of 37.3, with 80% lymphocytes  The absolute lymphocyte count was 30,000.   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 The interval history is essentially stable. His wife [name] continues to have significant pain issues, which have never been fully elucidated, and occasionally requires admission for uncontrolled pain. One of their sons is currently in [place] working as an Air cabin crew. The other son is in [place], under very structured conditions. [name] walks 2-3 miles every night.  REVIEW OF SYSTEMS: He denies any temperatures, fevers, rash, bleeding, drenching sweats, unexplained fatigue or weight change, or any adenopathy. There have been no intercurrent infections. A detailed review of systems is otherwise entirely negative.  PAST MEDICAL HISTORY: Past Medical History  Diagnosis Date  . 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 Gout, unspecified 12/22/2010  Significant for hypertension, diabetes, hypercholesterolemia, coronary artery disease status post coronary artery bypass graft in 1998, history of septoplasty, history of sleep apnea, which apparently has resolved with aggressive treatment of his rhinitis, history of colonic polyps, history of peptic ulcer disease and history of remote and minimal tobacco abuse.   PAST SURGICAL HISTORY: No past surgical history on file.  FAMILY HISTORY Mr. Rebel's  father died at the age of 65 from "old age" according to Mr. Nguyenthi.  His mother died at 51 with a cerebrovascular accident.  She had many children, of whom 12 made it to adulthood.  All of his brothers older than he (with 1 exception) have had prostate cancer.    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 His wife of >40 years, [name], is disabled secondary to a pain syndrome followed at [facility] and [place].  They have 2 children surviving.  One son was born with hyaline membrane and died suddenly at the age of 56, about 5 years ago.  Son [name] teaches poly-sci at [employer]. Son [name] lives in [place]. He has significant psychosocial issues not documented here. The Ropers are Mormons.    ADVANCED DIRECTIVES: in place  HEALTH MAINTENANCE: History  Substance Use Topics  . Smoking status: Not on file  . Smokeless tobacco: Not on file  . Alcohol Use: Not on file     Colonoscopy:  PAP:  Bone density:  Lipid panel:  No Known Allergies  Current Outpatient Prescriptions  Medication Sig Dispense Refill  . ALPRAZolam (XANAX) 1 MG tablet Take 1 mg by mouth at bedtime as needed.      Marland Kitchen atorvastatin (LIPITOR) 40 MG tablet Take 40 mg by mouth daily.      . ferrous fumarate (HEMOCYTE - 106 MG FE) 325 (106 FE) MG TABS Take 1 tablet by mouth.      . metFORMIN (GLUCOPHAGE) 500 MG tablet Take 500 mg by mouth 2 (two) times daily with a meal.      . metoprolol succinate (TOPROL-XL) 50 MG 24 hr tablet Take 50 mg by mouth daily.  Take with or immediately following a meal.      . nateglinide (STARLIX) 60 MG tablet Take 60 mg by mouth 3 (three) times daily before meals.      . quinapril (ACCUPRIL) 10 MG tablet Take 10 mg by mouth at bedtime.      Marland Kitchen allopurinol (ZYLOPRIM) 300 MG tablet Take 1 tablet (300 mg total) by mouth daily.  90 tablet  12  . DISCONTD: allopurinol (ZYLOPRIM) 300 MG tablet         OBJECTIVE: Middle-aged white male who appears well Filed  Vitals:   08/06/11 1421  BP: 110/72  Pulse: 66  Temp: 98.3 F (36.8 C)     Body mass index is 26.28 kg/(m^2).    ECOG FS: 0  Sclerae unicteric Oropharynx clear No cervical or supraclavicular adenopathy; no axillary adenopathy Lungs no rales or rhonchi, good excursion bilaterally Heart regular rate and rhythm, no murmur appreciated Abd soft, nontender, positive bowel sounds; the tip of the spleen is easily palpable approximately 2 inches below the left costal margin at the left midclavicular line MSK no focal spinal tenderness, no peripheral edema Neuro: nonfocal  LAB RESULTS: Lab Results  Component Value Date   WBC 170.6* 07/30/2011   NEUTROABS 7.7* 07/30/2011   HGB 11.9* 07/30/2011   HCT 36.4* 07/30/2011   MCV 86.7 07/30/2011   PLT 122* 07/30/2011      Chemistry      Component Value Date/Time   NA 141 07/30/2011 1419   K 4.1 07/30/2011 1419   CL 106 07/30/2011 1419   CO2 25 07/30/2011 1419   BUN 15 07/30/2011 1419   CREATININE 1.36* 07/30/2011 1419      Component Value Date/Time   CALCIUM 8.8 07/30/2011 1419   ALKPHOS 70 07/30/2011 1419   AST 17 07/30/2011 1419   ALT 12 07/30/2011 1419   BILITOT 1.6* 07/30/2011 1419       No results found for this basename: LABCA2    No components found with this basename: [id number]    No results found for this basename: INR:1;PROTIME:1 in the last 168 hours  Urinalysis    Component Value Date/Time   LABSPEC 1.010 04/24/2007 1443    STUDIES: No new results found.  ASSESSMENT: 71 y.o. [place] man with a history of chronic lymphoid leukemia initially diagnosed in May 2004.  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  He has notr accepted treatment so far.   PLAN: There is no obvious trend in his absolute lymphocyte count or his total IgG. We're going to continue to follow those and other labs on an every 2 month basis. He is very aware of how he is doing,  and if any problems develop he will let me know, otherwise he will see me again in one year. Today I refilled his allopurinol prescription.  MAGRINAT,GUSTAV C    08/06/2011

## 2011-08-06 NOTE — Telephone Encounter (Signed)
gve the pt his sept-aug 2014 appt calendars

## 2011-08-08 ENCOUNTER — Other Ambulatory Visit: Payer: Self-pay | Admitting: Physician Assistant

## 2011-10-02 ENCOUNTER — Other Ambulatory Visit (HOSPITAL_BASED_OUTPATIENT_CLINIC_OR_DEPARTMENT_OTHER): Payer: Medicare Other | Admitting: Lab

## 2011-10-02 DIAGNOSIS — C9110 Chronic lymphocytic leukemia of B-cell type not having achieved remission: Secondary | ICD-10-CM

## 2011-10-02 LAB — TECHNOLOGIST REVIEW

## 2011-10-02 LAB — CBC WITH DIFFERENTIAL/PLATELET
BASO%: 0.1 % (ref 0.0–2.0)
HCT: 36.2 % — ABNORMAL LOW (ref 38.4–49.9)
MCHC: 33.7 g/dL (ref 32.0–36.0)
MONO#: 3 10*3/uL — ABNORMAL HIGH (ref 0.1–0.9)
NEUT%: 3.9 % — ABNORMAL LOW (ref 39.0–75.0)
WBC: 156.6 10*3/uL (ref 4.0–10.3)
lymph#: 147 10*3/uL — ABNORMAL HIGH (ref 0.9–3.3)

## 2011-10-23 ENCOUNTER — Other Ambulatory Visit: Payer: Self-pay | Admitting: Dermatology

## 2011-11-27 ENCOUNTER — Other Ambulatory Visit (HOSPITAL_BASED_OUTPATIENT_CLINIC_OR_DEPARTMENT_OTHER): Payer: Medicare Other

## 2011-11-27 DIAGNOSIS — C9110 Chronic lymphocytic leukemia of B-cell type not having achieved remission: Secondary | ICD-10-CM

## 2011-11-27 LAB — COMPREHENSIVE METABOLIC PANEL (CC13)
Albumin: 4.4 g/dL (ref 3.5–5.0)
BUN: 20 mg/dL (ref 7.0–26.0)
CO2: 30 mEq/L — ABNORMAL HIGH (ref 22–29)
Calcium: 10.1 mg/dL (ref 8.4–10.4)
Chloride: 106 mEq/L (ref 98–107)
Glucose: 104 mg/dl — ABNORMAL HIGH (ref 70–99)
Potassium: 3.6 mEq/L (ref 3.5–5.1)
Sodium: 144 mEq/L (ref 136–145)
Total Protein: 6.8 g/dL (ref 6.4–8.3)

## 2011-11-27 LAB — CBC WITH DIFFERENTIAL/PLATELET
Basophils Absolute: 0.1 10*3/uL (ref 0.0–0.1)
Eosinophils Absolute: 0.2 10*3/uL (ref 0.0–0.5)
HGB: 12.8 g/dL — ABNORMAL LOW (ref 13.0–17.1)
MCV: 87.2 fL (ref 79.3–98.0)
MONO#: 4.4 10*3/uL — ABNORMAL HIGH (ref 0.1–0.9)
NEUT#: 7.3 10*3/uL — ABNORMAL HIGH (ref 1.5–6.5)
RDW: 15.5 % — ABNORMAL HIGH (ref 11.0–14.6)
lymph#: 168.5 10*3/uL — ABNORMAL HIGH (ref 0.9–3.3)

## 2011-11-27 LAB — URIC ACID (CC13): Uric Acid, Serum: 6.7 mg/dl (ref 2.6–7.4)

## 2011-11-27 LAB — LACTATE DEHYDROGENASE (CC13): LDH: 175 U/L (ref 125–220)

## 2012-01-15 ENCOUNTER — Encounter: Payer: Self-pay | Admitting: Oncology

## 2012-01-22 ENCOUNTER — Other Ambulatory Visit (HOSPITAL_BASED_OUTPATIENT_CLINIC_OR_DEPARTMENT_OTHER): Payer: Medicare Other | Admitting: Lab

## 2012-01-22 DIAGNOSIS — C9110 Chronic lymphocytic leukemia of B-cell type not having achieved remission: Secondary | ICD-10-CM

## 2012-01-22 LAB — CBC WITH DIFFERENTIAL/PLATELET
BASO%: 0.1 % (ref 0.0–2.0)
EOS%: 0.1 % (ref 0.0–7.0)
HCT: 37.7 % — ABNORMAL LOW (ref 38.4–49.9)
MCH: 29 pg (ref 27.2–33.4)
MCHC: 33.1 g/dL (ref 32.0–36.0)
NEUT%: 3.9 % — ABNORMAL LOW (ref 39.0–75.0)
RBC: 4.29 10*6/uL (ref 4.20–5.82)
RDW: 16.3 % — ABNORMAL HIGH (ref 11.0–14.6)
lymph#: 192 10*3/uL — ABNORMAL HIGH (ref 0.9–3.3)

## 2012-01-22 LAB — COMPREHENSIVE METABOLIC PANEL (CC13)
ALT: 11 U/L (ref 0–55)
CO2: 28 mEq/L (ref 22–29)
Calcium: 9.3 mg/dL (ref 8.4–10.4)
Chloride: 104 mEq/L (ref 98–107)
Creatinine: 1.3 mg/dL (ref 0.7–1.3)

## 2012-01-22 LAB — IGG, IGA, IGM
IgG (Immunoglobin G), Serum: 541 mg/dL — ABNORMAL LOW (ref 650–1600)
IgM, Serum: 16 mg/dL — ABNORMAL LOW (ref 41–251)

## 2012-01-22 LAB — URIC ACID (CC13): Uric Acid, Serum: 6.4 mg/dl (ref 2.6–7.4)

## 2012-02-01 ENCOUNTER — Other Ambulatory Visit: Payer: Self-pay | Admitting: Oncology

## 2012-02-01 NOTE — Progress Notes (Signed)
Called patient re labs. He is doing "really well". He tells me "I know when to yell from this and". Specifically he is having no problems with fevers, drenching sweats, weight loss, as terrific energy, and "my real problems are personal". Of course we have discussed those previously. At this point his next lab draw will be in late February. He does not have an appointment with [place] and it'll July.

## 2012-02-05 ENCOUNTER — Telehealth: Payer: Self-pay | Admitting: Oncology

## 2012-02-05 NOTE — Telephone Encounter (Signed)
Sent letter to pt. From Dr. Darnelle Catalan

## 2012-02-28 ENCOUNTER — Other Ambulatory Visit: Payer: Self-pay | Admitting: Family Medicine

## 2012-02-28 ENCOUNTER — Ambulatory Visit
Admission: RE | Admit: 2012-02-28 | Discharge: 2012-02-28 | Disposition: A | Discharge status: Home / Self Care | Payer: Medicare Other | Source: Ambulatory Visit | Attending: Family Medicine | Admitting: Family Medicine

## 2012-02-28 DIAGNOSIS — M79669 Pain in unspecified lower leg: Secondary | ICD-10-CM

## 2012-02-28 DIAGNOSIS — M7989 Other specified soft tissue disorders: Secondary | ICD-10-CM

## 2012-03-18 ENCOUNTER — Other Ambulatory Visit: Payer: Medicare Other

## 2012-03-19 ENCOUNTER — Other Ambulatory Visit (HOSPITAL_BASED_OUTPATIENT_CLINIC_OR_DEPARTMENT_OTHER): Payer: Medicare Other

## 2012-03-19 DIAGNOSIS — C9110 Chronic lymphocytic leukemia of B-cell type not having achieved remission: Secondary | ICD-10-CM

## 2012-03-19 LAB — COMPREHENSIVE METABOLIC PANEL (CC13)
AST: 15 U/L (ref 5–34)
Alkaline Phosphatase: 87 U/L (ref 40–150)
BUN: 22.8 mg/dL (ref 7.0–26.0)
Creatinine: 1.5 mg/dL — ABNORMAL HIGH (ref 0.7–1.3)
Potassium: 3.9 mEq/L (ref 3.5–5.1)
Total Bilirubin: 1.31 mg/dL — ABNORMAL HIGH (ref 0.20–1.20)

## 2012-03-19 LAB — CBC WITH DIFFERENTIAL/PLATELET
BASO%: 0.1 % (ref 0.0–2.0)
Basophils Absolute: 0.2 10*3/uL — ABNORMAL HIGH (ref 0.0–0.1)
EOS%: 0.1 % (ref 0.0–7.0)
HGB: 12.3 g/dL — ABNORMAL LOW (ref 13.0–17.1)
MCH: 29.2 pg (ref 27.2–33.4)
MCHC: 33.8 g/dL (ref 32.0–36.0)
RDW: 16.3 % — ABNORMAL HIGH (ref 11.0–14.6)
lymph#: 193.3 10*3/uL — ABNORMAL HIGH (ref 0.9–3.3)

## 2012-03-19 LAB — IGG, IGA, IGM
IgA: 48 mg/dL — ABNORMAL LOW (ref 68–379)
IgG (Immunoglobin G), Serum: 562 mg/dL — ABNORMAL LOW (ref 650–1600)
IgM, Serum: 16 mg/dL — ABNORMAL LOW (ref 41–251)

## 2012-03-19 LAB — TECHNOLOGIST REVIEW

## 2012-03-19 LAB — URIC ACID (CC13): Uric Acid, Serum: 7.2 mg/dl (ref 2.6–7.4)

## 2012-05-12 ENCOUNTER — Encounter: Payer: Self-pay | Admitting: Lab

## 2012-05-13 ENCOUNTER — Other Ambulatory Visit (HOSPITAL_BASED_OUTPATIENT_CLINIC_OR_DEPARTMENT_OTHER): Payer: Medicare Other | Admitting: Lab

## 2012-05-13 DIAGNOSIS — C9110 Chronic lymphocytic leukemia of B-cell type not having achieved remission: Secondary | ICD-10-CM

## 2012-05-13 LAB — CBC WITH DIFFERENTIAL/PLATELET
HCT: 36.8 % — ABNORMAL LOW (ref 38.4–49.9)
HGB: 12 g/dL — ABNORMAL LOW (ref 13.0–17.1)
Platelets: 111 10*3/uL — ABNORMAL LOW (ref 140–400)
WBC: 168.1 10*3/uL (ref 4.0–10.3)

## 2012-05-13 LAB — COMPREHENSIVE METABOLIC PANEL (CC13)
ALT: 10 U/L (ref 0–55)
AST: 17 U/L (ref 5–34)
Albumin: 4 g/dL (ref 3.5–5.0)
Alkaline Phosphatase: 86 U/L (ref 40–150)
Calcium: 9.2 mg/dL (ref 8.4–10.4)
Chloride: 106 mEq/L (ref 98–107)
Potassium: 4.1 mEq/L (ref 3.5–5.1)

## 2012-05-13 LAB — MANUAL DIFFERENTIAL
Basophil: 0 % (ref 0–2)
EOS: 0 % (ref 0–7)
MONO: 0 % (ref 0–14)
Myelocytes: 0 % (ref 0–0)
Other Cell: 0 % (ref 0–0)
PLT EST: DECREASED
PROMYELO: 0 % (ref 0–0)
SEG: 3 % — ABNORMAL LOW (ref 38–77)
nRBC: 0 % (ref 0–0)

## 2012-05-13 LAB — URIC ACID (CC13): Uric Acid, Serum: 5.5 mg/dl (ref 2.6–7.4)

## 2012-05-14 LAB — IGG, IGA, IGM
IgA: 40 mg/dL — ABNORMAL LOW (ref 68–379)
IgG (Immunoglobin G), Serum: 526 mg/dL — ABNORMAL LOW (ref 650–1600)
IgM, Serum: 10 mg/dL — ABNORMAL LOW (ref 41–251)

## 2012-07-08 ENCOUNTER — Other Ambulatory Visit (HOSPITAL_BASED_OUTPATIENT_CLINIC_OR_DEPARTMENT_OTHER): Payer: Medicare Other | Admitting: Lab

## 2012-07-08 DIAGNOSIS — C9110 Chronic lymphocytic leukemia of B-cell type not having achieved remission: Secondary | ICD-10-CM

## 2012-07-08 LAB — IGG, IGA, IGM
IgA: 43 mg/dL — ABNORMAL LOW (ref 68–379)
IgG (Immunoglobin G), Serum: 526 mg/dL — ABNORMAL LOW (ref 650–1600)
IgM, Serum: 12 mg/dL — ABNORMAL LOW (ref 41–251)

## 2012-07-08 LAB — COMPREHENSIVE METABOLIC PANEL (CC13)
ALT: 11 U/L (ref 0–55)
AST: 13 U/L (ref 5–34)
Alkaline Phosphatase: 86 U/L (ref 40–150)
BUN: 13.6 mg/dL (ref 7.0–26.0)
Calcium: 9.3 mg/dL (ref 8.4–10.4)
Creatinine: 1.3 mg/dL (ref 0.7–1.3)
Total Bilirubin: 1.25 mg/dL — ABNORMAL HIGH (ref 0.20–1.20)

## 2012-07-08 LAB — CBC WITH DIFFERENTIAL/PLATELET
BASO%: 0.2 % (ref 0.0–2.0)
EOS%: 0.2 % (ref 0.0–7.0)
HGB: 12.1 g/dL — ABNORMAL LOW (ref 13.0–17.1)
MCH: 28.7 pg (ref 27.2–33.4)
MCV: 85.3 fL (ref 79.3–98.0)
MONO%: 1.7 % (ref 0.0–14.0)
RBC: 4.21 10*6/uL (ref 4.20–5.82)
RDW: 16.1 % — ABNORMAL HIGH (ref 11.0–14.6)
lymph#: 160.4 10*3/uL — ABNORMAL HIGH (ref 0.9–3.3)

## 2012-08-07 ENCOUNTER — Other Ambulatory Visit: Payer: Self-pay | Admitting: *Deleted

## 2012-08-07 ENCOUNTER — Ambulatory Visit (HOSPITAL_BASED_OUTPATIENT_CLINIC_OR_DEPARTMENT_OTHER): Payer: Medicare Other | Admitting: Oncology

## 2012-08-07 VITALS — BP 122/69 | HR 69 | Temp 97.5°F | Resp 20 | Ht 65.0 in | Wt 156.6 lb

## 2012-08-07 DIAGNOSIS — C9110 Chronic lymphocytic leukemia of B-cell type not having achieved remission: Secondary | ICD-10-CM

## 2012-08-07 MED ORDER — ALLOPURINOL 300 MG PO TABS: 300.0000 mg | ORAL_TABLET | Freq: Every day | ORAL | Status: DC

## 2012-08-07 NOTE — Progress Notes (Signed)
ID: [name]   DOB: Jun 04, 1940  MR#: [id number]  EXB#:[id number]  PCP: Javier Floro, MD GYN: SU:  OTHER MD:  HISTORY OF PRESENT ILLNESS: The patient had had multiple liver function tests and electrolytes, but no complete blood count since 1998 (that one apparently was normal) until he was referred to [name] for evaluation of muscle aches, possibly related to his cholesterol-lowering medication.  As part of the workup, Dr. Coral [name] obtained a complete blood count on Jun 15, 2002, which showed a hemoglobin of 13.3, MCV 79.5, platelets 178,000 and a white cell count of 37.3, with 80% lymphocytes  The absolute lymphocyte count was 30,000.   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 The interval history is  stable. Unfortunately his wife [name] continues to have her pain syndrome, which has not abated. One of his sons has been made been documented disease in a University in [place]. The second son continues to have significant problems related to schizoaffective disorder.  REVIEW OF SYSTEMS: [name] is stressed by all this but he knows how to walk every day 2-3 miles and uses other techniques to manage that issue. She has had a basal cell removed since the last visit here. He is to immune develop some photophobia. He continues to work in his gout and diabetes, but these are stable. There has been no fever, no unusual infection, no unexplained weight loss or fatigue, no bleeding, rash, or adenopathy. A detailed review of systems was otherwise noncontributory  PAST MEDICAL HISTORY: Past Medical History  Diagnosis Date  . 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 Gout, unspecified 12/22/2010  Significant for hypertension, diabetes, hypercholesterolemia, coronary artery disease status post coronary artery bypass graft in 1998, history of septoplasty, history of sleep apnea, which apparently has resolved with aggressive treatment  of his rhinitis, history of colonic polyps, history of peptic ulcer disease and history of remote and minimal tobacco abuse.   PAST SURGICAL HISTORY: No past surgical history on file.  FAMILY HISTORY [name]'s father died at the age of 59 from "old age" according to [name].  His mother died at 29 with a cerebrovascular accident.  She had many children, of whom 12 made it to adulthood.  All of his brothers older than he (with 1 exception) have had prostate cancer.    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 His wife of >40 years, [name], is disabled secondary to a pain syndrome followed at [facility] and [place].  They have 2 children surviving.  One son was born with hyaline membrane and died suddenly at the age of 24, about 5 years ago.  Son [name] teaches poly-sci at [employer]. Son [name] lives in [place]. He has significant psychosocial issues not documented here. The Ropers are Mormons.    ADVANCED DIRECTIVES: in place  HEALTH MAINTENANCE: History  Substance Use Topics  . Smoking status: Not on file  . Smokeless tobacco: Not on file  . Alcohol Use: Not on file     Colonoscopy:  PAP:  Bone density:  Lipid panel:  No Known Allergies  Current Outpatient Prescriptions  Medication Sig Dispense Refill  . allopurinol (ZYLOPRIM) 300 MG tablet Take 1 tablet (300 mg total) by mouth daily.  90 tablet  12  . ALPRAZolam (XANAX) 1 MG tablet Take 1 mg by mouth at bedtime as needed.      Marland Kitchen atorvastatin (LIPITOR) 40 MG tablet  Take 40 mg by mouth daily.      . ferrous fumarate (HEMOCYTE - 106 MG FE) 325 (106 FE) MG TABS Take 1 tablet by mouth.      . metFORMIN (GLUCOPHAGE) 500 MG tablet Take 500 mg by mouth 2 (two) times daily with a meal.      . metoprolol succinate (TOPROL-XL) 50 MG 24 hr tablet Take 50 mg by mouth daily. Take with or immediately following a meal.      . nateglinide (STARLIX) 60 MG tablet Take 60 mg by mouth 3 (three) times daily  before meals.      . quinapril (ACCUPRIL) 10 MG tablet Take 10 mg by mouth at bedtime.       No current facility-administered medications for this visit.    OBJECTIVE: Middle-aged white male in no acute distress Filed Vitals:   08/07/12 1335  BP: 122/69  Pulse: 69  Temp: 97.5 F (36.4 C)  Resp: 20     Body mass index is 26.06 kg/(m^2).    ECOG FS: 0  Sclerae unicteric Oropharynx clear, no thrush or other lesions No cervical or supraclavicular adenopathy; no axillary adenopathy Lungs no rales or rhonchi, good excursion bilaterally Heart regular rate and rhythm, no murmur appreciated Abd soft, nontender, positive bowel sounds; the tip of the spleen is easily palpable approximately 2 inches below the left costal margin at the left midclavicular line MSK no focal spinal tenderness, no peripheral edema Neuro: nonfocal, well oriented, stressed affect  LAB RESULTS: Lab Results  Component Value Date   WBC 170.6* 07/08/2012   NEUTROABS 6.6* 07/08/2012   HGB 12.1* 07/08/2012   HCT 35.9* 07/08/2012   MCV 85.3 07/08/2012   PLT 115* 07/08/2012      Chemistry      Component Value Date/Time   NA 143 07/08/2012 1321   NA 141 07/30/2011 1419   K 4.1 07/08/2012 1321   K 4.1 07/30/2011 1419   CL 109* 07/08/2012 1321   CL 106 07/30/2011 1419   CO2 25 07/08/2012 1321   CO2 25 07/30/2011 1419   BUN 13.6 07/08/2012 1321   BUN 15 07/30/2011 1419   CREATININE 1.3 07/08/2012 1321   CREATININE 1.36* 07/30/2011 1419      Component Value Date/Time   CALCIUM 9.3 07/08/2012 1321   CALCIUM 8.8 07/30/2011 1419   ALKPHOS 86 07/08/2012 1321   ALKPHOS 70 07/30/2011 1419   AST 13 07/08/2012 1321   AST 17 07/30/2011 1419   ALT 11 07/08/2012 1321   ALT 12 07/30/2011 1419   BILITOT 1.25* 07/08/2012 1321   BILITOT 1.6* 07/30/2011 1419       No results found for this basename: LABCA2    No components found with this basename: [id number]    No results found for this basename: INR,  in the last 168 hours  Urinalysis     Component Value Date/Time   LABSPEC 1.010 04/24/2007 1443    STUDIES: No new results found.  ASSESSMENT: 72 y.o. [place] man with a history of chronic lymphoid leukemia initially diagnosed in May 2004.  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  He has notr accepted treatment so far.   PLAN: [name] knows he meets criteria for therapy and we have plenty of options for him including the new is one of course Ibrutinib. He is hoping to be able to avoid treatment for another year. He does  understand that the treatment is likely to receive, most likely rituximab, is generally very well tolerated and should not compromises functional status.  At any rate but he wants to do is to continue what we have been doing, namely check labwork every 3 months and see me once a year. If his lab work significantly changes versus functional status declines he will call me and we will start therapy at that time.  Kassidee Narciso C    08/07/2012

## 2012-08-08 ENCOUNTER — Telehealth: Payer: Self-pay | Admitting: *Deleted

## 2012-08-08 NOTE — Telephone Encounter (Signed)
sw pt gv appt d/t for lab on 09/02/12@1 :30pm. Pt is aware and is aware that i will mail a letter/avs due to hes busy at the moment...td

## 2012-08-27 ENCOUNTER — Other Ambulatory Visit: Payer: Self-pay

## 2012-09-02 ENCOUNTER — Other Ambulatory Visit: Payer: Medicare Other | Admitting: Lab

## 2012-09-03 ENCOUNTER — Ambulatory Visit (HOSPITAL_BASED_OUTPATIENT_CLINIC_OR_DEPARTMENT_OTHER): Payer: Medicare Other | Admitting: Lab

## 2012-09-03 DIAGNOSIS — C9110 Chronic lymphocytic leukemia of B-cell type not having achieved remission: Secondary | ICD-10-CM

## 2012-09-03 LAB — CBC WITH DIFFERENTIAL/PLATELET
Basophils Absolute: 0.8 10*3/uL — ABNORMAL HIGH (ref 0.0–0.1)
Eosinophils Absolute: 0.2 10*3/uL (ref 0.0–0.5)
HCT: 39.9 % (ref 38.4–49.9)
HGB: 12.4 g/dL — ABNORMAL LOW (ref 13.0–17.1)
LYMPH%: 95.7 % — ABNORMAL HIGH (ref 14.0–49.0)
MCHC: 31.1 g/dL — ABNORMAL LOW (ref 32.0–36.0)
MONO#: 2.4 10*3/uL — ABNORMAL HIGH (ref 0.1–0.9)
NEUT#: 4.4 10*3/uL (ref 1.5–6.5)
NEUT%: 2.5 % — ABNORMAL LOW (ref 39.0–75.0)
Platelets: 125 10*3/uL — ABNORMAL LOW (ref 140–400)
WBC: 180.8 10*3/uL (ref 4.0–10.3)

## 2012-09-03 LAB — IGG, IGA, IGM
IgG (Immunoglobin G), Serum: 519 mg/dL — ABNORMAL LOW (ref 650–1600)
IgM, Serum: 8 mg/dL — ABNORMAL LOW (ref 41–251)

## 2012-09-03 LAB — COMPREHENSIVE METABOLIC PANEL (CC13)
AST: 18 U/L (ref 5–34)
BUN: 15.9 mg/dL (ref 7.0–26.0)
CO2: 27 mEq/L (ref 22–29)
Calcium: 9.3 mg/dL (ref 8.4–10.4)
Chloride: 107 mEq/L (ref 98–109)
Creatinine: 1.2 mg/dL (ref 0.7–1.3)
Total Bilirubin: 1.44 mg/dL — ABNORMAL HIGH (ref 0.20–1.20)

## 2012-09-03 LAB — TECHNOLOGIST REVIEW

## 2012-11-24 ENCOUNTER — Other Ambulatory Visit (HOSPITAL_BASED_OUTPATIENT_CLINIC_OR_DEPARTMENT_OTHER): Payer: Medicare Other

## 2012-11-24 ENCOUNTER — Telehealth: Payer: Self-pay | Admitting: Oncology

## 2012-11-24 DIAGNOSIS — C9110 Chronic lymphocytic leukemia of B-cell type not having achieved remission: Secondary | ICD-10-CM

## 2012-11-24 LAB — COMPREHENSIVE METABOLIC PANEL (CC13)
ALT: 12 U/L (ref 0–55)
AST: 18 U/L (ref 5–34)
Albumin: 4.1 g/dL (ref 3.5–5.0)
Alkaline Phosphatase: 88 U/L (ref 40–150)
BUN: 15.3 mg/dL (ref 7.0–26.0)
Potassium: 4.1 mEq/L (ref 3.5–5.1)
Sodium: 141 mEq/L (ref 136–145)
Total Protein: 6.7 g/dL (ref 6.4–8.3)

## 2012-11-24 LAB — CBC WITH DIFFERENTIAL/PLATELET
Basophils Absolute: 0.6 10*3/uL — ABNORMAL HIGH (ref 0.0–0.1)
Eosinophils Absolute: 0.3 10*3/uL (ref 0.0–0.5)
HCT: 36.6 % — ABNORMAL LOW (ref 38.4–49.9)
HGB: 11.8 g/dL — ABNORMAL LOW (ref 13.0–17.1)
NEUT#: 9.4 10*3/uL — ABNORMAL HIGH (ref 1.5–6.5)
RDW: 16.2 % — ABNORMAL HIGH (ref 11.0–14.6)
lymph#: 214.1 10*3/uL — ABNORMAL HIGH (ref 0.9–3.3)

## 2012-11-24 LAB — LACTATE DEHYDROGENASE (CC13): LDH: 218 U/L (ref 125–245)

## 2012-11-24 NOTE — Telephone Encounter (Signed)
pt called to r/s lab to today....done

## 2012-11-25 ENCOUNTER — Other Ambulatory Visit: Payer: Medicare Other | Admitting: Lab

## 2012-11-25 LAB — IGG, IGA, IGM
IgA: 42 mg/dL — ABNORMAL LOW (ref 68–379)
IgM, Serum: 10 mg/dL — ABNORMAL LOW (ref 41–251)

## 2012-11-27 ENCOUNTER — Other Ambulatory Visit: Payer: Self-pay

## 2013-02-17 ENCOUNTER — Other Ambulatory Visit (HOSPITAL_BASED_OUTPATIENT_CLINIC_OR_DEPARTMENT_OTHER): Payer: Medicare Other

## 2013-02-17 DIAGNOSIS — C9110 Chronic lymphocytic leukemia of B-cell type not having achieved remission: Secondary | ICD-10-CM

## 2013-02-17 LAB — CBC WITH DIFFERENTIAL/PLATELET
HEMATOCRIT: 38.9 % (ref 38.4–49.9)
HEMOGLOBIN: 12.6 g/dL — AB (ref 13.0–17.1)
MCH: 28.3 pg (ref 27.2–33.4)
MCHC: 32.2 g/dL (ref 32.0–36.0)
MCV: 87.7 fL (ref 79.3–98.0)
Platelets: 137 10*3/uL — ABNORMAL LOW (ref 140–400)
RBC: 4.44 10*6/uL (ref 4.20–5.82)
RDW: 16.2 % — AB (ref 11.0–14.6)
WBC: 218.6 10*3/uL — AB (ref 4.0–10.3)

## 2013-02-17 LAB — COMPREHENSIVE METABOLIC PANEL (CC13)
ALK PHOS: 79 U/L (ref 40–150)
ALT: 9 U/L (ref 0–55)
AST: 15 U/L (ref 5–34)
Albumin: 4.4 g/dL (ref 3.5–5.0)
Anion Gap: 9 mEq/L (ref 3–11)
BILIRUBIN TOTAL: 1.44 mg/dL — AB (ref 0.20–1.20)
BUN: 19.5 mg/dL (ref 7.0–26.0)
CO2: 26 mEq/L (ref 22–29)
Calcium: 9.4 mg/dL (ref 8.4–10.4)
Chloride: 106 mEq/L (ref 98–109)
Creatinine: 1.3 mg/dL (ref 0.7–1.3)
Glucose: 115 mg/dl (ref 70–140)
Potassium: 4.7 mEq/L (ref 3.5–5.1)
SODIUM: 141 meq/L (ref 136–145)
Total Protein: 7 g/dL (ref 6.4–8.3)

## 2013-02-17 LAB — MANUAL DIFFERENTIAL
ALC: 212 10*3/uL — ABNORMAL HIGH (ref 0.9–3.3)
ANC (CHCC manual diff): 4.4 10*3/uL (ref 1.5–6.5)
BAND NEUTROPHILS: 0 % (ref 0–10)
BLASTS: 0 % (ref 0–0)
Basophil: 0 % (ref 0–2)
EOS%: 0 % (ref 0–7)
LYMPH: 97 % — AB (ref 14–49)
MONO: 1 % (ref 0–14)
Metamyelocytes: 0 % (ref 0–0)
Myelocytes: 0 % (ref 0–0)
NRBC: 0 % (ref 0–0)
OTHER CELL: 0 % (ref 0–0)
PLT EST: DECREASED
PROMYELO: 0 % (ref 0–0)
SEG: 2 % — ABNORMAL LOW (ref 38–77)
Variant Lymph: 0 % (ref 0–0)

## 2013-02-17 LAB — LACTATE DEHYDROGENASE (CC13): LDH: 187 U/L (ref 125–245)

## 2013-02-18 LAB — IGG, IGA, IGM
IGA: 42 mg/dL — AB (ref 68–379)
IGG (IMMUNOGLOBIN G), SERUM: 471 mg/dL — AB (ref 650–1600)
IGM, SERUM: 11 mg/dL — AB (ref 41–251)

## 2013-04-09 ENCOUNTER — Ambulatory Visit: Payer: Medicare Other | Admitting: Cardiology

## 2013-04-16 ENCOUNTER — Encounter: Payer: Self-pay | Admitting: General Surgery

## 2013-04-16 DIAGNOSIS — I10 Essential (primary) hypertension: Secondary | ICD-10-CM

## 2013-04-16 DIAGNOSIS — E782 Mixed hyperlipidemia: Secondary | ICD-10-CM

## 2013-04-16 DIAGNOSIS — I2510 Atherosclerotic heart disease of native coronary artery without angina pectoris: Secondary | ICD-10-CM | POA: Insufficient documentation

## 2013-04-27 ENCOUNTER — Ambulatory Visit: Payer: Medicare Other | Admitting: Cardiology

## 2013-05-12 ENCOUNTER — Other Ambulatory Visit (HOSPITAL_BASED_OUTPATIENT_CLINIC_OR_DEPARTMENT_OTHER): Payer: Medicare Other

## 2013-05-12 DIAGNOSIS — C9110 Chronic lymphocytic leukemia of B-cell type not having achieved remission: Secondary | ICD-10-CM

## 2013-05-12 LAB — CBC WITH DIFFERENTIAL/PLATELET
HCT: 38.6 % (ref 38.4–49.9)
HGB: 12.5 g/dL — ABNORMAL LOW (ref 13.0–17.1)
MCH: 27.9 pg (ref 27.2–33.4)
MCHC: 32.4 g/dL (ref 32.0–36.0)
MCV: 86 fL (ref 79.3–98.0)
PLATELETS: 132 10*3/uL — AB (ref 140–400)
RBC: 4.49 10*6/uL (ref 4.20–5.82)
RDW: 15.8 % — ABNORMAL HIGH (ref 11.0–14.6)
WBC: 200.5 10*3/uL (ref 4.0–10.3)

## 2013-05-12 LAB — COMPREHENSIVE METABOLIC PANEL (CC13)
ALBUMIN: 4.4 g/dL (ref 3.5–5.0)
ALT: 9 U/L (ref 0–55)
ANION GAP: 12 meq/L — AB (ref 3–11)
AST: 15 U/L (ref 5–34)
Alkaline Phosphatase: 85 U/L (ref 40–150)
BILIRUBIN TOTAL: 1.47 mg/dL — AB (ref 0.20–1.20)
BUN: 17 mg/dL (ref 7.0–26.0)
CO2: 27 meq/L (ref 22–29)
Calcium: 9.7 mg/dL (ref 8.4–10.4)
Chloride: 104 mEq/L (ref 98–109)
Creatinine: 1.4 mg/dL — ABNORMAL HIGH (ref 0.7–1.3)
GLUCOSE: 145 mg/dL — AB (ref 70–140)
POTASSIUM: 4.1 meq/L (ref 3.5–5.1)
Sodium: 143 mEq/L (ref 136–145)
TOTAL PROTEIN: 6.8 g/dL (ref 6.4–8.3)

## 2013-05-12 LAB — MANUAL DIFFERENTIAL
ALC: 190.5 10*3/uL — AB (ref 0.9–3.3)
ANC (CHCC MAN DIFF): 10 10*3/uL — AB (ref 1.5–6.5)
BASOPHIL: 0 % (ref 0–2)
Band Neutrophils: 0 % (ref 0–10)
Blasts: 0 % (ref 0–0)
EOS: 0 % (ref 0–7)
LYMPH: 95 % — AB (ref 14–49)
METAMYELOCYTES PCT: 0 % (ref 0–0)
MONO: 0 % (ref 0–14)
MYELOCYTES: 0 % (ref 0–0)
Other Cell: 0 % (ref 0–0)
PLT EST: DECREASED
PROMYELO: 0 % (ref 0–0)
SEG: 5 % — ABNORMAL LOW (ref 38–77)
Variant Lymph: 0 % (ref 0–0)
nRBC: 0 % (ref 0–0)

## 2013-05-12 LAB — LACTATE DEHYDROGENASE (CC13): LDH: 164 U/L (ref 125–245)

## 2013-05-13 LAB — IGG, IGA, IGM
IGG (IMMUNOGLOBIN G), SERUM: 482 mg/dL — AB (ref 650–1600)
IgA: 39 mg/dL — ABNORMAL LOW (ref 68–379)
IgM, Serum: 10 mg/dL — ABNORMAL LOW (ref 41–251)

## 2013-05-14 ENCOUNTER — Ambulatory Visit (INDEPENDENT_AMBULATORY_CARE_PROVIDER_SITE_OTHER): Payer: Medicare Other | Admitting: Cardiology

## 2013-05-14 ENCOUNTER — Encounter: Payer: Self-pay | Admitting: Cardiology

## 2013-05-14 VITALS — BP 128/73 | HR 64 | Ht 65.0 in | Wt 159.1 lb

## 2013-05-14 DIAGNOSIS — I10 Essential (primary) hypertension: Secondary | ICD-10-CM

## 2013-05-14 DIAGNOSIS — E782 Mixed hyperlipidemia: Secondary | ICD-10-CM

## 2013-05-14 DIAGNOSIS — I2510 Atherosclerotic heart disease of native coronary artery without angina pectoris: Secondary | ICD-10-CM

## 2013-05-14 NOTE — Progress Notes (Signed)
Javier, [place] [name], [place]  [address] Phone: [phone] Fax:  [phone]  Date:  05/14/2013   ID:  [name], DOB [birth year], MRN [id number]  PCP:   Melinda Crutch, MD  Cardiologist:  Fransico Him, MD     History of Present Illness: [name] is a 73 y.o. male with a history of ASCAD s/p CABG in 1998, dyslipidemia and HTN who presents today for followup.  He is doing well.  He denies any chest pain, SOB, DOE, LE edema, dizziness, palpitations or syncope.     Wt Readings from Last 3 Encounters:  08/07/12 156 lb 9.6 oz (71.033 kg)  08/06/11 157 lb 14.4 oz (71.623 kg)  02/06/11 159 lb (72.122 kg)     Past Medical History  Diagnosis Date  . 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 Gout, unspecified 12/22/2010  . Diabetes mellitus without complication     Type II  . MI (myocardial infarction) 1998  . Lymphocytic leukemia     Chronic followed by Dr Florene Glen at [facility]  . Anxiety   . Dysplastic nevus 01/1996    melanoma 2013 followed by dermatologist Dr Denna Haggard  . Mixed dyslipidemia   . Hypertension   . Coronary artery disease     s/p CABG 1998    Current Outpatient Prescriptions  Medication Sig Dispense Refill  . allopurinol (ZYLOPRIM) 300 MG tablet Take 1 tablet (300 mg total) by mouth daily.  90 tablet  12  . ALPRAZolam (XANAX) 1 MG tablet Take 1 mg by mouth at bedtime as needed.      Marland Kitchen aspirin 81 MG tablet Take 81 mg by mouth daily.      Marland Kitchen atorvastatin (LIPITOR) 40 MG tablet Take 40 mg by mouth daily.      . ferrous fumarate (HEMOCYTE - 106 MG FE) 325 (106 FE) MG TABS Take 1 tablet by mouth.      . metFORMIN (GLUCOPHAGE) 500 MG tablet Take 500 mg by mouth 2 (two) times daily with a meal.      . metoprolol succinate (TOPROL-XL) 50 MG 24 hr tablet Take 50 mg by mouth daily. Take with or immediately following a meal.      . nateglinide (STARLIX) 60 MG tablet Take 60 mg by mouth 3 (three) times daily before meals.      .  quinapril (ACCUPRIL) 10 MG tablet Take 10 mg by mouth at bedtime.       No current facility-administered medications for this visit.    Allergies:   No Known Allergies  Social History:  The patient  reports that he has never smoked. He does not have any smokeless tobacco history on file. He reports that he does not drink alcohol or use illicit drugs.   Family History:  The patient's family history is not on file.   ROS:  Please see the history of present illness.      All other systems reviewed and negative.   PHYSICAL EXAM: VS:  There were no vitals taken for this visit. 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 ASCAD s/p remoted CABG with no angina - continue ASA 2. HTN well controlled - continue metoprolol/quinapril 3. Dyslipidemia - check fasting lipid and ALT - continue Lipitor  Followup with me in 1 year  Signed, Fransico Him, MD 05/14/2013 10:05 AM

## 2013-05-14 NOTE — Patient Instructions (Signed)
Your physician recommends that you continue on your current medications as directed. Please refer to the Current Medication list given to you today.  Your physician recommends that you return for a FASTING lipid profile: Please schedule this at check out today  Your physician wants you to follow-up in: 1 year with DR Mallie Snooks will receive a reminder letter in the mail two months in advance. If you don't receive a letter, please call our office to schedule the follow-up appointment.

## 2013-06-08 ENCOUNTER — Other Ambulatory Visit (INDEPENDENT_AMBULATORY_CARE_PROVIDER_SITE_OTHER): Payer: Medicare Other

## 2013-06-08 DIAGNOSIS — E782 Mixed hyperlipidemia: Secondary | ICD-10-CM

## 2013-06-08 LAB — HEPATIC FUNCTION PANEL
ALBUMIN: 4.5 g/dL (ref 3.5–5.2)
ALT: 15 U/L (ref 0–53)
AST: 19 U/L (ref 0–37)
Alkaline Phosphatase: 63 U/L (ref 39–117)
BILIRUBIN DIRECT: 0.1 mg/dL (ref 0.0–0.3)
Total Bilirubin: 1.9 mg/dL — ABNORMAL HIGH (ref 0.2–1.2)
Total Protein: 6.7 g/dL (ref 6.0–8.3)

## 2013-06-08 LAB — LIPID PANEL
Cholesterol: 119 mg/dL (ref 0–200)
HDL: 28.4 mg/dL — AB (ref >39.00)
LDL CALC: 37 mg/dL (ref 0–99)
TRIGLYCERIDES: 269 mg/dL — AB (ref 0.0–149.0)
Total CHOL/HDL Ratio: 4
VLDL: 53.8 mg/dL — ABNORMAL HIGH (ref 0.0–40.0)

## 2013-06-11 ENCOUNTER — Encounter: Payer: Self-pay | Admitting: General Surgery

## 2013-06-11 ENCOUNTER — Other Ambulatory Visit: Payer: Self-pay | Admitting: General Surgery

## 2013-06-11 DIAGNOSIS — E782 Mixed hyperlipidemia: Secondary | ICD-10-CM

## 2013-06-30 ENCOUNTER — Telehealth: Payer: Self-pay | Admitting: *Deleted

## 2013-06-30 NOTE — Telephone Encounter (Signed)
Spoke to [name] concerning appt with Dr. Jana Hakim on 08/04/13. R/s and confirmed new appt on 08/04/13 with [name] at 1145. [name] will have labs at 1115. [name] denies needs at this time.

## 2013-08-03 ENCOUNTER — Other Ambulatory Visit: Payer: Self-pay | Admitting: *Deleted

## 2013-08-03 DIAGNOSIS — C9110 Chronic lymphocytic leukemia of B-cell type not having achieved remission: Secondary | ICD-10-CM

## 2013-08-04 ENCOUNTER — Other Ambulatory Visit: Payer: Self-pay | Admitting: *Deleted

## 2013-08-04 ENCOUNTER — Other Ambulatory Visit: Payer: Medicare Other

## 2013-08-04 ENCOUNTER — Ambulatory Visit (HOSPITAL_BASED_OUTPATIENT_CLINIC_OR_DEPARTMENT_OTHER): Payer: Medicare Other | Admitting: Adult Health

## 2013-08-04 ENCOUNTER — Encounter: Payer: Self-pay | Admitting: Adult Health

## 2013-08-04 ENCOUNTER — Other Ambulatory Visit (HOSPITAL_BASED_OUTPATIENT_CLINIC_OR_DEPARTMENT_OTHER): Payer: Medicare Other

## 2013-08-04 ENCOUNTER — Ambulatory Visit: Payer: Medicare Other | Admitting: Oncology

## 2013-08-04 VITALS — BP 131/66 | HR 65 | Temp 97.8°F | Resp 18 | Ht 65.0 in | Wt 160.2 lb

## 2013-08-04 DIAGNOSIS — C9110 Chronic lymphocytic leukemia of B-cell type not having achieved remission: Secondary | ICD-10-CM

## 2013-08-04 LAB — COMPREHENSIVE METABOLIC PANEL (CC13)
ALK PHOS: 68 U/L (ref 40–150)
ALT: 10 U/L (ref 0–55)
AST: 15 U/L (ref 5–34)
Albumin: 4 g/dL (ref 3.5–5.0)
Anion Gap: 9 mEq/L (ref 3–11)
BILIRUBIN TOTAL: 1.54 mg/dL — AB (ref 0.20–1.20)
BUN: 19.1 mg/dL (ref 7.0–26.0)
CO2: 30 meq/L — AB (ref 22–29)
Calcium: 10 mg/dL (ref 8.4–10.4)
Chloride: 106 mEq/L (ref 98–109)
Creatinine: 1.5 mg/dL — ABNORMAL HIGH (ref 0.7–1.3)
Glucose: 120 mg/dl (ref 70–140)
Potassium: 4.2 mEq/L (ref 3.5–5.1)
Sodium: 145 mEq/L (ref 136–145)
TOTAL PROTEIN: 6.5 g/dL (ref 6.4–8.3)

## 2013-08-04 LAB — CBC WITH DIFFERENTIAL/PLATELET
BASO%: 0.4 % (ref 0.0–2.0)
BASOS ABS: 0.9 10*3/uL — AB (ref 0.0–0.1)
EOS ABS: 0.2 10*3/uL (ref 0.0–0.5)
EOS%: 0.1 % (ref 0.0–7.0)
HCT: 39 % (ref 38.4–49.9)
HEMOGLOBIN: 11.9 g/dL — AB (ref 13.0–17.1)
LYMPH%: 95.4 % — ABNORMAL HIGH (ref 14.0–49.0)
MCH: 27.7 pg (ref 27.2–33.4)
MCHC: 30.5 g/dL — ABNORMAL LOW (ref 32.0–36.0)
MCV: 90.9 fL (ref 79.3–98.0)
MONO#: 2.8 10*3/uL — ABNORMAL HIGH (ref 0.1–0.9)
MONO%: 1.5 % (ref 0.0–14.0)
NEUT%: 2.6 % — ABNORMAL LOW (ref 39.0–75.0)
NEUTROS ABS: 4.9 10*3/uL (ref 1.5–6.5)
NRBC: 0 % (ref 0–0)
Platelets: 130 10*3/uL — ABNORMAL LOW (ref 140–400)
RBC: 4.29 10*6/uL (ref 4.20–5.82)
RDW: 15.9 % — AB (ref 11.0–14.6)
WBC: 190.7 10*3/uL (ref 4.0–10.3)
lymph#: 181.9 10*3/uL — ABNORMAL HIGH (ref 0.9–3.3)

## 2013-08-04 LAB — LACTATE DEHYDROGENASE (CC13): LDH: 174 U/L (ref 125–245)

## 2013-08-04 LAB — TECHNOLOGIST REVIEW

## 2013-08-04 MED ORDER — ALLOPURINOL 300 MG PO TABS: 300.0000 mg | ORAL_TABLET | Freq: Every day | ORAL | Status: DC

## 2013-08-04 NOTE — Progress Notes (Signed)
ID: [name]   DOB: 10/21/1940  MR#: [id number]  GDJ#:[id number]  PCP:  Melinda Crutch, MD GYN: SU:  OTHER MD:  HISTORY OF PRESENT ILLNESS: The patient had had multiple liver function tests and electrolytes, but no complete blood count since 1998 (that one apparently was normal) until he was referred to [facility] for evaluation of muscle aches, possibly related to his cholesterol-lowering medication.  As part of the workup, Dr. Rockwell Alexandria obtained a complete blood count on Jun 15, 2002, which showed a hemoglobin of 13.3, MCV 79.5, platelets 178,000 and a white cell count of 37.3, with 80% lymphocytes  The absolute lymphocyte count was 30,000.   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 The interval history is  stable. Unfortunately he lost his wife last October, 2014.  One of his sons remains as a Scientist, physiological in [place], and his other son is out [place].  He continues to have good energy, and denies fevers, chills, night sweats, unintentional weight loss, or any further concerns.    REVIEW OF SYSTEMS: A 10 point review of systems was conducted and is otherwise negative except for what is noted above.     PAST MEDICAL HISTORY: Past Medical History  Diagnosis Date  . 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 Gout, unspecified 12/22/2010  . Diabetes mellitus without complication     Type II  . MI (myocardial infarction) 1998  . Lymphocytic leukemia     Chronic followed by Dr Florene Glen at [facility]  . Anxiety   . Dysplastic nevus 01/1996    melanoma 2013 followed by dermatologist Dr Denna Haggard  . Mixed dyslipidemia   . Hypertension   . Coronary artery disease     s/p CABG 1998  Significant for hypertension, diabetes, hypercholesterolemia, coronary artery disease status post coronary artery bypass graft in 1998, history of septoplasty, history of sleep apnea, which apparently has resolved with aggressive treatment of his rhinitis, history of  colonic polyps, history of peptic ulcer disease and history of remote and minimal tobacco abuse.   PAST SURGICAL HISTORY: Past Surgical History  Procedure Laterality Date  . Coronary artery bypass graft  1998    X7    FAMILY HISTORY Mr. Peggs's father died at the age of 14 from "old age" according to Mr. Seney.  His mother died at 43 with a cerebrovascular accident.  She had many children, of whom 12 made it to adulthood.  All of his brothers older than he (with 1 exception) have had prostate cancer.    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 His wife of >40 years, [name], is disabled secondary to a pain syndrome followed at [facility] and [place].  They have 2 children surviving.  One son was born with hyaline membrane and died suddenly at the age of 29, about 5 years ago.  Son [name] teaches poly-sci at [employer]. Son [name] lives in [place]. He has significant psychosocial issues not documented here. The Ropers are Mormons.    ADVANCED DIRECTIVES: in place  HEALTH MAINTENANCE: History  Substance Use Topics  . Smoking status: Never Smoker   . Smokeless tobacco: Not on file  . Alcohol Use: No     Colonoscopy:  PAP:  Bone density:  Lipid panel:  No Known Allergies  Current Outpatient Prescriptions  Medication Sig Dispense Refill  . aspirin 81 MG tablet Take 81 mg by mouth daily.      Marland Kitchen  atorvastatin (LIPITOR) 40 MG tablet Take 40 mg by mouth daily.      . IRON, FERROUS GLUCONATE, PO Take by mouth. 365      . metFORMIN (GLUCOPHAGE) 500 MG tablet Take 500 mg by mouth 2 (two) times daily with a meal.      . metoprolol succinate (TOPROL-XL) 50 MG 24 hr tablet Take 50 mg by mouth daily. Take with or immediately following a meal.      . nateglinide (STARLIX) 60 MG tablet Take 60 mg by mouth 3 (three) times daily before meals.      . quinapril (ACCUPRIL) 10 MG tablet Take 10 mg by mouth at bedtime.      Marland Kitchen allopurinol (ZYLOPRIM) 300 MG tablet  Take 1 tablet (300 mg total) by mouth daily.  90 tablet  12  . ALPRAZolam (XANAX) 1 MG tablet Take 1 mg by mouth at bedtime as needed.       No current facility-administered medications for this visit.    OBJECTIVE: Middle-aged white male in no acute distress Filed Vitals:   08/04/13 1124  BP: 131/66  Pulse: 65  Temp: 97.8 F (36.6 C)  Resp: 18     Body mass index is 26.66 kg/(m^2).    ECOG FS: 0  Sclerae unicteric Oropharynx clear, no thrush or other lesions No cervical or supraclavicular adenopathy; no axillary adenopathy Lungs no rales or rhonchi, good excursion bilaterally Heart regular rate and rhythm, no murmur appreciated Abd soft, nontender, positive bowel sounds; the tip of the spleen is easily palpable approximately 2 inches below the left costal margin at the left midclavicular line MSK no focal spinal tenderness, no peripheral edema Neuro: nonfocal, well oriented, stressed affect  LAB RESULTS: Lab Results  Component Value Date   WBC 190.7* 08/04/2013   NEUTROABS 4.9 08/04/2013   HGB 11.9* 08/04/2013   HCT 39.0 08/04/2013   MCV 90.9 08/04/2013   PLT 130* 08/04/2013      Chemistry      Component Value Date/Time   NA 143 05/12/2013 1321   NA 141 07/30/2011 1419   K 4.1 05/12/2013 1321   K 4.1 07/30/2011 1419   CL 109* 07/08/2012 1321   CL 106 07/30/2011 1419   CO2 27 05/12/2013 1321   CO2 25 07/30/2011 1419   BUN 17.0 05/12/2013 1321   BUN 15 07/30/2011 1419   CREATININE 1.4* 05/12/2013 1321   CREATININE 1.36* 07/30/2011 1419      Component Value Date/Time   CALCIUM 9.7 05/12/2013 1321   CALCIUM 8.8 07/30/2011 1419   ALKPHOS 63 06/08/2013 0951   ALKPHOS 85 05/12/2013 1321   AST 19 06/08/2013 0951   AST 15 05/12/2013 1321   ALT 15 06/08/2013 0951   ALT 9 05/12/2013 1321   BILITOT 1.9* 06/08/2013 0951   BILITOT 1.47* 05/12/2013 1321       No results found for this basename: LABCA2    No components found with this basename: [id number]    No results found for this basename:  INR,  in the last 168 hours  Urinalysis    Component Value Date/Time   LABSPEC 1.010 04/24/2007 1443    STUDIES: No new results found.  ASSESSMENT: 73 y.o. [name] man with a history of chronic lymphoid leukemia initially diagnosed in May 2004.  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  He has notr accepted treatment so far.   [name]:  [name] continues to do very well from a clinical standpoint.  He continues to walk 2 miles per day and roller blades at the park.  His labs are elevated and he does meet requirement for therapy, however he continues to decline therapy due to the fact that he has no symptoms.    What he wants to do is to continue what we have been doing, namely check labwork every 3 months and see Dr. Jana Hakim annually. If his lab work significantly changes versus functional status declines he will call our office and we will start therapy at that time.  I spent 15 minutes counseling the patient face to face.  The total time spent in the appointment was 30 minutes.  Minette Headland, [place] [phone] 08/04/2013

## 2013-08-04 NOTE — Patient Instructions (Signed)
You are doing well.  Your lab work remains stable.  We will continue to follow your labs every three months, and see you back in one year.  Please call us if you have any questions or concerns.

## 2013-08-06 ENCOUNTER — Telehealth: Payer: Self-pay | Admitting: Oncology

## 2013-08-06 NOTE — Telephone Encounter (Signed)
S/w the pt and he is aware of his lab appts and the md visit in 2016.

## 2013-08-13 ENCOUNTER — Other Ambulatory Visit: Payer: Medicare Other

## 2013-10-12 ENCOUNTER — Other Ambulatory Visit: Payer: Medicare Other

## 2013-10-20 ENCOUNTER — Other Ambulatory Visit (INDEPENDENT_AMBULATORY_CARE_PROVIDER_SITE_OTHER): Payer: Medicare Other | Admitting: *Deleted

## 2013-10-20 DIAGNOSIS — E782 Mixed hyperlipidemia: Secondary | ICD-10-CM

## 2013-10-20 LAB — LIPID PANEL
Cholesterol: 126 mg/dL (ref 0–200)
HDL: 21.3 mg/dL — ABNORMAL LOW (ref >39.00)
NonHDL: 104.7
Total CHOL/HDL Ratio: 6
Triglycerides: 338 mg/dL — ABNORMAL HIGH (ref 0.0–149.0)
VLDL: 67.6 mg/dL — ABNORMAL HIGH (ref 0.0–40.0)

## 2013-10-20 LAB — HEPATIC FUNCTION PANEL
ALT: 13 U/L (ref 0–53)
AST: 19 U/L (ref 0–37)
Albumin: 4.2 g/dL (ref 3.5–5.2)
Alkaline Phosphatase: 62 U/L (ref 39–117)
BILIRUBIN TOTAL: 1.3 mg/dL — AB (ref 0.2–1.2)
Bilirubin, Direct: 0.1 mg/dL (ref 0.0–0.3)
Total Protein: 6.6 g/dL (ref 6.0–8.3)

## 2013-10-20 LAB — LDL CHOLESTEROL, DIRECT: LDL DIRECT: 42.8 mg/dL

## 2013-11-05 ENCOUNTER — Other Ambulatory Visit: Payer: Medicare Other

## 2013-11-05 ENCOUNTER — Other Ambulatory Visit: Payer: Self-pay | Admitting: *Deleted

## 2013-11-05 ENCOUNTER — Other Ambulatory Visit (HOSPITAL_BASED_OUTPATIENT_CLINIC_OR_DEPARTMENT_OTHER): Payer: Medicare Other

## 2013-11-05 DIAGNOSIS — C9110 Chronic lymphocytic leukemia of B-cell type not having achieved remission: Secondary | ICD-10-CM

## 2013-11-05 LAB — CBC WITH DIFFERENTIAL/PLATELET
BASO%: 0.3 % (ref 0.0–2.0)
Basophils Absolute: 0.6 10*3/uL — ABNORMAL HIGH (ref 0.0–0.1)
EOS%: 0.1 % (ref 0.0–7.0)
Eosinophils Absolute: 0.2 10*3/uL (ref 0.0–0.5)
HEMATOCRIT: 40 % (ref 38.4–49.9)
HGB: 12.5 g/dL — ABNORMAL LOW (ref 13.0–17.1)
LYMPH#: 174.7 10*3/uL — AB (ref 0.9–3.3)
LYMPH%: 95.5 % — AB (ref 14.0–49.0)
MCH: 28.1 pg (ref 27.2–33.4)
MCHC: 31.3 g/dL — ABNORMAL LOW (ref 32.0–36.0)
MCV: 89.9 fL (ref 79.3–98.0)
MONO#: 2.7 10*3/uL — ABNORMAL HIGH (ref 0.1–0.9)
MONO%: 1.5 % (ref 0.0–14.0)
NEUT#: 4.8 10*3/uL (ref 1.5–6.5)
NEUT%: 2.6 % — AB (ref 39.0–75.0)
PLATELETS: 130 10*3/uL — AB (ref 140–400)
RBC: 4.45 10*6/uL (ref 4.20–5.82)
RDW: 15.7 % — ABNORMAL HIGH (ref 11.0–14.6)
WBC: 183 10*3/uL (ref 4.0–10.3)
nRBC: 0 % (ref 0–0)

## 2013-11-05 LAB — COMPREHENSIVE METABOLIC PANEL (CC13)
ALT: 12 U/L (ref 0–55)
AST: 15 U/L (ref 5–34)
Albumin: 4 g/dL (ref 3.5–5.0)
Alkaline Phosphatase: 80 U/L (ref 40–150)
Anion Gap: 8 mEq/L (ref 3–11)
BUN: 20.1 mg/dL (ref 7.0–26.0)
CO2: 28 meq/L (ref 22–29)
Calcium: 10 mg/dL (ref 8.4–10.4)
Chloride: 108 mEq/L (ref 98–109)
Creatinine: 1.4 mg/dL — ABNORMAL HIGH (ref 0.7–1.3)
GLUCOSE: 109 mg/dL (ref 70–140)
Potassium: 4.4 mEq/L (ref 3.5–5.1)
Sodium: 143 mEq/L (ref 136–145)
TOTAL PROTEIN: 6.6 g/dL (ref 6.4–8.3)
Total Bilirubin: 1.25 mg/dL — ABNORMAL HIGH (ref 0.20–1.20)

## 2013-11-05 LAB — TECHNOLOGIST REVIEW

## 2013-11-05 LAB — LACTATE DEHYDROGENASE (CC13): LDH: 171 U/L (ref 125–245)

## 2013-11-06 ENCOUNTER — Other Ambulatory Visit: Payer: Self-pay

## 2014-02-11 ENCOUNTER — Other Ambulatory Visit: Payer: Medicare Other

## 2014-02-11 ENCOUNTER — Other Ambulatory Visit (HOSPITAL_BASED_OUTPATIENT_CLINIC_OR_DEPARTMENT_OTHER): Payer: Medicare Other

## 2014-02-11 DIAGNOSIS — C9110 Chronic lymphocytic leukemia of B-cell type not having achieved remission: Secondary | ICD-10-CM

## 2014-02-11 LAB — CBC WITH DIFFERENTIAL/PLATELET
BASO%: 0.2 % (ref 0.0–2.0)
BASOS ABS: 0.3 10*3/uL — AB (ref 0.0–0.1)
EOS ABS: 0.4 10*3/uL (ref 0.0–0.5)
EOS%: 0.2 % (ref 0.0–7.0)
HCT: 38.4 % (ref 38.4–49.9)
HGB: 12 g/dL — ABNORMAL LOW (ref 13.0–17.1)
LYMPH#: 193.1 10*3/uL — AB (ref 0.9–3.3)
LYMPH%: 92.7 % — ABNORMAL HIGH (ref 14.0–49.0)
MCH: 27.1 pg — ABNORMAL LOW (ref 27.2–33.4)
MCHC: 31.2 g/dL — ABNORMAL LOW (ref 32.0–36.0)
MCV: 86.9 fL (ref 79.3–98.0)
MONO#: 7.1 10*3/uL — AB (ref 0.1–0.9)
MONO%: 3.4 % (ref 0.0–14.0)
NEUT#: 7.3 10*3/uL — ABNORMAL HIGH (ref 1.5–6.5)
NEUT%: 3.5 % — ABNORMAL LOW (ref 39.0–75.0)
PLATELETS: 140 10*3/uL (ref 140–400)
RBC: 4.41 10*6/uL (ref 4.20–5.82)
RDW: 16.4 % — ABNORMAL HIGH (ref 11.0–14.6)
WBC: 208.2 10*3/uL (ref 4.0–10.3)

## 2014-02-11 LAB — COMPREHENSIVE METABOLIC PANEL (CC13)
ALT: 11 U/L (ref 0–55)
AST: 16 U/L (ref 5–34)
Albumin: 4.2 g/dL (ref 3.5–5.0)
Alkaline Phosphatase: 72 U/L (ref 40–150)
Anion Gap: 10 mEq/L (ref 3–11)
BILIRUBIN TOTAL: 0.96 mg/dL (ref 0.20–1.20)
BUN: 18.3 mg/dL (ref 7.0–26.0)
CALCIUM: 9 mg/dL (ref 8.4–10.4)
CO2: 26 mEq/L (ref 22–29)
Chloride: 106 mEq/L (ref 98–109)
Creatinine: 1.4 mg/dL — ABNORMAL HIGH (ref 0.7–1.3)
EGFR: 51 mL/min/{1.73_m2} — ABNORMAL LOW (ref >90)
Glucose: 140 mg/dl (ref 70–140)
Potassium: 4.7 mEq/L (ref 3.5–5.1)
Sodium: 143 mEq/L (ref 136–145)
TOTAL PROTEIN: 6.4 g/dL (ref 6.4–8.3)

## 2014-02-11 LAB — TECHNOLOGIST REVIEW

## 2014-02-11 LAB — LACTATE DEHYDROGENASE (CC13): LDH: 162 U/L (ref 125–245)

## 2014-02-24 ENCOUNTER — Encounter: Payer: Self-pay | Admitting: Cardiology

## 2014-04-01 ENCOUNTER — Other Ambulatory Visit: Payer: Self-pay | Admitting: Orthopedic Surgery

## 2014-04-28 ENCOUNTER — Encounter (HOSPITAL_BASED_OUTPATIENT_CLINIC_OR_DEPARTMENT_OTHER): Payer: Medicare Other

## 2014-04-28 ENCOUNTER — Encounter (HOSPITAL_BASED_OUTPATIENT_CLINIC_OR_DEPARTMENT_OTHER): Payer: Self-pay | Admitting: *Deleted

## 2014-04-28 DIAGNOSIS — M65842 Other synovitis and tenosynovitis, left hand: Secondary | ICD-10-CM | POA: Diagnosis not present

## 2014-04-28 DIAGNOSIS — M109 Gout, unspecified: Secondary | ICD-10-CM | POA: Diagnosis not present

## 2014-04-28 DIAGNOSIS — E119 Type 2 diabetes mellitus without complications: Secondary | ICD-10-CM | POA: Diagnosis not present

## 2014-04-28 DIAGNOSIS — Z951 Presence of aortocoronary bypass graft: Secondary | ICD-10-CM | POA: Diagnosis not present

## 2014-04-28 DIAGNOSIS — M65332 Trigger finger, left middle finger: Secondary | ICD-10-CM | POA: Diagnosis not present

## 2014-04-28 DIAGNOSIS — I10 Essential (primary) hypertension: Secondary | ICD-10-CM | POA: Diagnosis not present

## 2014-04-28 DIAGNOSIS — M65322 Trigger finger, left index finger: Secondary | ICD-10-CM | POA: Diagnosis not present

## 2014-04-28 DIAGNOSIS — F419 Anxiety disorder, unspecified: Secondary | ICD-10-CM | POA: Diagnosis not present

## 2014-04-28 DIAGNOSIS — I252 Old myocardial infarction: Secondary | ICD-10-CM | POA: Diagnosis not present

## 2014-04-28 DIAGNOSIS — I2510 Atherosclerotic heart disease of native coronary artery without angina pectoris: Secondary | ICD-10-CM | POA: Diagnosis not present

## 2014-04-28 DIAGNOSIS — E782 Mixed hyperlipidemia: Secondary | ICD-10-CM | POA: Diagnosis not present

## 2014-04-28 DIAGNOSIS — Z856 Personal history of leukemia: Secondary | ICD-10-CM | POA: Diagnosis not present

## 2014-04-28 LAB — BASIC METABOLIC PANEL
Anion gap: 9 (ref 5–15)
BUN: 17 mg/dL (ref 6–23)
CHLORIDE: 104 mmol/L (ref 96–112)
CO2: 27 mmol/L (ref 19–32)
Calcium: 9.2 mg/dL (ref 8.4–10.5)
Creatinine, Ser: 1.5 mg/dL — ABNORMAL HIGH (ref 0.50–1.35)
GFR calc non Af Amer: 44 mL/min — ABNORMAL LOW (ref >90)
GFR, EST AFRICAN AMERICAN: 52 mL/min — AB (ref >90)
Glucose, Bld: 157 mg/dL — ABNORMAL HIGH (ref 70–99)
Potassium: 4.2 mmol/L (ref 3.5–5.1)
Sodium: 140 mmol/L (ref 135–145)

## 2014-04-28 NOTE — Progress Notes (Signed)
Had ekg dr turner 4/15-coming in for bmet-

## 2014-04-30 ENCOUNTER — Ambulatory Visit (HOSPITAL_BASED_OUTPATIENT_CLINIC_OR_DEPARTMENT_OTHER): Payer: Medicare Other | Admitting: Anesthesiology

## 2014-04-30 ENCOUNTER — Encounter (HOSPITAL_BASED_OUTPATIENT_CLINIC_OR_DEPARTMENT_OTHER): Payer: Self-pay | Admitting: *Deleted

## 2014-04-30 ENCOUNTER — Ambulatory Visit (HOSPITAL_BASED_OUTPATIENT_CLINIC_OR_DEPARTMENT_OTHER)
Admission: RE | Admit: 2014-04-30 | Discharge: 2014-04-30 | Disposition: A | Discharge status: Home / Self Care | Payer: Medicare Other | Source: Ambulatory Visit | Attending: Orthopedic Surgery | Admitting: Orthopedic Surgery

## 2014-04-30 ENCOUNTER — Encounter (HOSPITAL_BASED_OUTPATIENT_CLINIC_OR_DEPARTMENT_OTHER)
Admission: RE | Disposition: A | Discharge status: Home / Self Care | Payer: Self-pay | Source: Ambulatory Visit | Attending: Orthopedic Surgery

## 2014-04-30 DIAGNOSIS — Z951 Presence of aortocoronary bypass graft: Secondary | ICD-10-CM | POA: Insufficient documentation

## 2014-04-30 DIAGNOSIS — E782 Mixed hyperlipidemia: Secondary | ICD-10-CM | POA: Insufficient documentation

## 2014-04-30 DIAGNOSIS — M65322 Trigger finger, left index finger: Secondary | ICD-10-CM | POA: Insufficient documentation

## 2014-04-30 DIAGNOSIS — M65842 Other synovitis and tenosynovitis, left hand: Secondary | ICD-10-CM | POA: Insufficient documentation

## 2014-04-30 DIAGNOSIS — F419 Anxiety disorder, unspecified: Secondary | ICD-10-CM | POA: Insufficient documentation

## 2014-04-30 DIAGNOSIS — I2510 Atherosclerotic heart disease of native coronary artery without angina pectoris: Secondary | ICD-10-CM | POA: Insufficient documentation

## 2014-04-30 DIAGNOSIS — Z856 Personal history of leukemia: Secondary | ICD-10-CM | POA: Insufficient documentation

## 2014-04-30 DIAGNOSIS — M65332 Trigger finger, left middle finger: Secondary | ICD-10-CM | POA: Diagnosis not present

## 2014-04-30 DIAGNOSIS — E119 Type 2 diabetes mellitus without complications: Secondary | ICD-10-CM | POA: Insufficient documentation

## 2014-04-30 DIAGNOSIS — M109 Gout, unspecified: Secondary | ICD-10-CM | POA: Insufficient documentation

## 2014-04-30 DIAGNOSIS — I10 Essential (primary) hypertension: Secondary | ICD-10-CM | POA: Insufficient documentation

## 2014-04-30 DIAGNOSIS — I252 Old myocardial infarction: Secondary | ICD-10-CM | POA: Insufficient documentation

## 2014-04-30 HISTORY — DX: Presence of dental prosthetic device (complete) (partial): Z97.2

## 2014-04-30 HISTORY — PX: TRIGGER FINGER RELEASE: SHX641

## 2014-04-30 LAB — GLUCOSE, CAPILLARY
Glucose-Capillary: 112 mg/dL — ABNORMAL HIGH (ref 70–99)
Glucose-Capillary: 117 mg/dL — ABNORMAL HIGH (ref 70–99)

## 2014-04-30 LAB — POCT HEMOGLOBIN-HEMACUE: Hemoglobin: 13.6 g/dL (ref 13.0–17.0)

## 2014-04-30 SURGERY — RELEASE, A1 PULLEY, FOR TRIGGER FINGER
Anesthesia: Monitor Anesthesia Care | Site: Finger | Laterality: Left

## 2014-04-30 MED ORDER — HYDROMORPHONE HCL 1 MG/ML IJ SOLN: 0.2500 mg | INTRAMUSCULAR | Status: DC | PRN

## 2014-04-30 MED ORDER — FENTANYL CITRATE 0.05 MG/ML IJ SOLN
INTRAMUSCULAR | Status: AC
  Filled 2014-04-30: qty 2

## 2014-04-30 MED ORDER — CEFAZOLIN SODIUM-DEXTROSE 2-3 GM-% IV SOLR: 2.0000 g | INTRAVENOUS | Status: DC

## 2014-04-30 MED ORDER — HYDROCODONE-ACETAMINOPHEN 5-325 MG PO TABS: 1.0000 | ORAL_TABLET | Freq: Four times a day (QID) | ORAL | Status: DC | PRN

## 2014-04-30 MED ORDER — MIDAZOLAM HCL 5 MG/5ML IJ SOLN
INTRAMUSCULAR | Status: DC | PRN
  Administered 2014-04-30: 1 mg via INTRAVENOUS

## 2014-04-30 MED ORDER — LACTATED RINGERS IV SOLN
INTRAVENOUS | Status: DC
  Administered 2014-04-30: 13:00:00 via INTRAVENOUS

## 2014-04-30 MED ORDER — CHLORHEXIDINE GLUCONATE 4 % EX LIQD: 60.0000 mL | Freq: Once | CUTANEOUS | Status: DC

## 2014-04-30 MED ORDER — PROPOFOL 10 MG/ML IV BOLUS
INTRAVENOUS | Status: DC | PRN
  Administered 2014-04-30: 10 mg via INTRAVENOUS
  Administered 2014-04-30: 20 mg via INTRAVENOUS

## 2014-04-30 MED ORDER — MIDAZOLAM HCL 2 MG/2ML IJ SOLN: 1.0000 mg | INTRAMUSCULAR | Status: DC | PRN

## 2014-04-30 MED ORDER — FENTANYL CITRATE 0.05 MG/ML IJ SOLN
INTRAMUSCULAR | Status: DC | PRN
  Administered 2014-04-30: 25 ug via INTRAVENOUS
  Administered 2014-04-30: 50 ug via INTRAVENOUS

## 2014-04-30 MED ORDER — CEFAZOLIN SODIUM-DEXTROSE 2-3 GM-% IV SOLR
INTRAVENOUS | Status: AC
  Filled 2014-04-30: qty 50

## 2014-04-30 MED ORDER — BUPIVACAINE HCL (PF) 0.25 % IJ SOLN
INTRAMUSCULAR | Status: DC | PRN
  Administered 2014-04-30: 6 mL

## 2014-04-30 MED ORDER — MIDAZOLAM HCL 2 MG/2ML IJ SOLN
INTRAMUSCULAR | Status: AC
  Filled 2014-04-30: qty 2

## 2014-04-30 MED ORDER — FENTANYL CITRATE 0.05 MG/ML IJ SOLN: 50.0000 ug | INTRAMUSCULAR | Status: DC | PRN

## 2014-04-30 MED ORDER — LIDOCAINE HCL (CARDIAC) 20 MG/ML IV SOLN
INTRAVENOUS | Status: DC | PRN
  Administered 2014-04-30: 20 mg via INTRAVENOUS

## 2014-04-30 MED ORDER — PROMETHAZINE HCL 25 MG/ML IJ SOLN: 6.2500 mg | INTRAMUSCULAR | Status: DC | PRN

## 2014-04-30 MED ORDER — CEFAZOLIN SODIUM-DEXTROSE 2-3 GM-% IV SOLR
2.0000 g | INTRAVENOUS | Status: AC
  Administered 2014-04-30: 2 g via INTRAVENOUS

## 2014-04-30 MED ORDER — OXYCODONE HCL 5 MG PO TABS: 5.0000 mg | ORAL_TABLET | Freq: Once | ORAL | Status: DC | PRN

## 2014-04-30 MED ORDER — PROPOFOL 10 MG/ML IV EMUL
INTRAVENOUS | Status: AC
  Filled 2014-04-30: qty 50

## 2014-04-30 MED ORDER — OXYCODONE HCL 5 MG/5ML PO SOLN: 5.0000 mg | Freq: Once | ORAL | Status: DC | PRN

## 2014-04-30 MED ORDER — BUPIVACAINE HCL (PF) 0.25 % IJ SOLN
INTRAMUSCULAR | Status: AC
  Filled 2014-04-30: qty 30

## 2014-04-30 MED ORDER — LIDOCAINE HCL (PF) 0.5 % IJ SOLN
INTRAMUSCULAR | Status: DC | PRN
  Administered 2014-04-30: 30 mL via INTRAVENOUS

## 2014-04-30 SURGICAL SUPPLY — 34 items
BANDAGE COBAN STERILE 2 (GAUZE/BANDAGES/DRESSINGS) ×3 IMPLANT
BLADE SURG 15 STRL LF DISP TIS (BLADE) ×1 IMPLANT
BLADE SURG 15 STRL SS (BLADE) ×3
BNDG CMPR 9X4 STRL LF SNTH (GAUZE/BANDAGES/DRESSINGS) ×1
BNDG ESMARK 4X9 LF (GAUZE/BANDAGES/DRESSINGS) ×2 IMPLANT
CHLORAPREP W/TINT 26ML (MISCELLANEOUS) ×3 IMPLANT
CORDS BIPOLAR (ELECTRODE) IMPLANT
COVER BACK TABLE 60X90IN (DRAPES) ×3 IMPLANT
COVER MAYO STAND STRL (DRAPES) ×3 IMPLANT
CUFF TOURNIQUET SINGLE 18IN (TOURNIQUET CUFF) ×2 IMPLANT
DECANTER SPIKE VIAL GLASS SM (MISCELLANEOUS) IMPLANT
DRAPE EXTREMITY T 121X128X90 (DRAPE) ×3 IMPLANT
DRAPE SURG 17X23 STRL (DRAPES) ×3 IMPLANT
GAUZE SPONGE 4X4 12PLY STRL (GAUZE/BANDAGES/DRESSINGS) ×3 IMPLANT
GAUZE XEROFORM 1X8 LF (GAUZE/BANDAGES/DRESSINGS) ×3 IMPLANT
GLOVE BIOGEL PI IND STRL 7.0 (GLOVE) IMPLANT
GLOVE BIOGEL PI IND STRL 8.5 (GLOVE) ×1 IMPLANT
GLOVE BIOGEL PI INDICATOR 7.0 (GLOVE) ×4
GLOVE BIOGEL PI INDICATOR 8.5 (GLOVE) ×2
GLOVE ECLIPSE 6.5 STRL STRAW (GLOVE) ×2 IMPLANT
GLOVE SURG ORTHO 8.0 STRL STRW (GLOVE) ×3 IMPLANT
GOWN STRL REUS W/ TWL LRG LVL3 (GOWN DISPOSABLE) ×1 IMPLANT
GOWN STRL REUS W/TWL LRG LVL3 (GOWN DISPOSABLE) ×3
GOWN STRL REUS W/TWL XL LVL3 (GOWN DISPOSABLE) ×3 IMPLANT
NDL PRECISIONGLIDE 27X1.5 (NEEDLE) ×1 IMPLANT
NEEDLE PRECISIONGLIDE 27X1.5 (NEEDLE) ×3 IMPLANT
NS IRRIG 1000ML POUR BTL (IV SOLUTION) ×3 IMPLANT
PACK BASIN DAY SURGERY FS (CUSTOM PROCEDURE TRAY) ×3 IMPLANT
STOCKINETTE 4X48 STRL (DRAPES) ×3 IMPLANT
SUT ETHILON 4 0 PS 2 18 (SUTURE) ×3 IMPLANT
SYR BULB 3OZ (MISCELLANEOUS) ×3 IMPLANT
SYR CONTROL 10ML LL (SYRINGE) ×3 IMPLANT
TOWEL OR 17X24 6PK STRL BLUE (TOWEL DISPOSABLE) ×4 IMPLANT
UNDERPAD 30X30 INCONTINENT (UNDERPADS AND DIAPERS) ×3 IMPLANT

## 2014-04-30 NOTE — Transfer of Care (Signed)
Immediate Anesthesia Transfer of Care Note  Patient: [name]  Procedure(s) Performed: Procedure(s): RELEASE A-1 PULLEYS LEFT INDEX AND LEFT LONG FINGERS  (Left)  Patient Location: PACU  Anesthesia Type:MAC and Bier block  Level of Consciousness: awake, alert  and oriented  Airway & Oxygen Therapy: Patient Spontanous Breathing  Post-op Assessment: Report given to RN and Post -op Vital signs reviewed and stable  Post vital signs: Reviewed and stable  Last Vitals:  Filed Vitals:   04/30/14 1412  BP:   Pulse: 79  Temp: 36.7 C  Resp:     Complications: No apparent anesthesia complications

## 2014-04-30 NOTE — H&P (Signed)
[name] is a 74 year old right hand dominant male complaining of catching of his left middle finger. This has been going on for nearly a year. He states the index finger is beginning to bother him also. He has no history of injury. He has heart disease and a bypass in 1998, high BP, diabetes and arthritis. He complains of intermittent moderate aching pain. He states it has not significantly changed. Activity makes it worse and rest makes it better.The middle finger continues to trigger on him. Sensation and circulation are intact. The index finger is triggering. He has had 2 injections.    PAST MEDICAL HISTORY:  He has no known drug allergies. He is on Metoprolol, Metformin and Lipitor. He is not on any anticoagulants. He had a hernia repair in 2009, cholecystectomy in 2004, hand surgery by myself in 2010 and open heart surgery.  FAMILY MEDICAL HISTORY: Positive for diabetes, heart disease, high BP and arthritis.   SOCIAL HISTORY:  He does not smoke or use alcohol. He is widowed and retired.  REVIEW OF SYSTEMS: Positive for ringing in his ears, high BP, headaches and anemia, otherwise negative 14 points.  [name] is an 74 y.o. male.   Chief Complaint: trigger fingers index and middle left HPI: see above  Past Medical History  Diagnosis Date  . 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 Gout, unspecified 12/22/2010  . Diabetes mellitus without complication     Type II  . MI (myocardial infarction) 1998  . Lymphocytic leukemia     Chronic followed by Dr Florene Glen at [facility]  . Anxiety   . Dysplastic nevus 01/1996    melanoma 2013 followed by dermatologist Dr Denna Haggard  . Mixed dyslipidemia   . Hypertension   . Coronary artery disease     s/p CABG 1998  . Wears partial dentures     Past Surgical History  Procedure Laterality Date  . Coronary artery bypass graft  1998    X7  . Cholecystectomy  2010  . Hernia repair  2010    rting hernia  . Colonoscopy     . Cardiac catheterization      History reviewed. No pertinent family history. Social History:  reports that he has never smoked. He does not have any smokeless tobacco history on file. He reports that he does not drink alcohol or use illicit drugs.  Allergies: No Known Allergies  Medications Prior to Admission  Medication Sig Dispense Refill  . allopurinol (ZYLOPRIM) 300 MG tablet Take 1 tablet (300 mg total) by mouth daily. 90 tablet 12  . ALPRAZolam (XANAX) 1 MG tablet Take 1 mg by mouth at bedtime as needed.    Marland Kitchen aspirin 81 MG tablet Take 81 mg by mouth daily.    Marland Kitchen atorvastatin (LIPITOR) 40 MG tablet Take 40 mg by mouth daily.    . IRON, FERROUS GLUCONATE, PO Take by mouth. 365    . metFORMIN (GLUCOPHAGE) 500 MG tablet Take 500 mg by mouth 2 (two) times daily with a meal.    . metoprolol succinate (TOPROL-XL) 50 MG 24 hr tablet Take 50 mg by mouth daily. Take with or immediately following a meal.    . nateglinide (STARLIX) 60 MG tablet Take 60 mg by mouth 3 (three) times daily before meals.    . quinapril (ACCUPRIL) 10 MG tablet Take 10 mg by mouth at bedtime.      Results for orders placed or performed during the hospital encounter of  04/30/14 (from the past 48 hour(s))  Basic metabolic panel     Status: Abnormal   Collection Time: 04/28/14  3:35 PM  Result Value Ref Range   Sodium 140 135 - 145 mmol/L   Potassium 4.2 3.5 - 5.1 mmol/L   Chloride 104 96 - 112 mmol/L   CO2 27 19 - 32 mmol/L   Glucose, Bld 157 (H) 70 - 99 mg/dL   BUN 17 6 - 23 mg/dL   Creatinine, Ser 1.50 (H) 0.50 - 1.35 mg/dL   Calcium 9.2 8.4 - 10.5 mg/dL   GFR calc non Af Amer 44 (L) >90 mL/min   GFR calc Af Amer 52 (L) >90 mL/min    Comment: (NOTE) The eGFR has been calculated using the CKD EPI equation. This calculation has not been validated in all clinical situations. eGFR's persistently <90 mL/min signify possible Chronic Kidney Disease.    Anion gap 9 5 - 15    No results found.   Pertinent  items are noted in HPI.  Height '5\' 5"'  (1.651 m), weight 72.576 kg (160 lb).  General appearance: alert, cooperative and appears stated age Head: Normocephalic, without obvious abnormality Neck: no JVD and supple, symmetrical, trachea midline Resp: clear to auscultation bilaterally Cardio: regular rate and rhythm, S1, S2 normal, no murmur, click, rub or gallop GI: soft, non-tender; bowel sounds normal; no masses,  no organomegaly Extremities: trigger loeft index and middle fingers Pulses: 2+ and symmetric Skin: Skin color, texture, turgor normal. No rashes or lesions Neurologic: Grossly normal Incision/Wound: na  Assessment/Plan Dx: STS left middle finger and  Index\  Plan:  We would recommend surgical decompression to each. Pre, peri and post op care are discussed along with risks and complications. Patient is aware there is no guarantee with surgery, possibility of infection, injury to arteries, nerves, and tendons, incomplete relief and dystrophy. He is scheduled for release A-1 pulley left index and left middle fingers as an outpatient under regional anesthesia.lan:   Minh Roanhorse R 04/30/2014, 12:25 PM

## 2014-04-30 NOTE — Brief Op Note (Signed)
04/30/2014  2:09 PM  PATIENT:  [name]  74 y.o. male  PRE-OPERATIVE DIAGNOSIS:  STENOSING TENOSYNOVITIS LEFT INDEX AND LEFT LONG FINGERS   POST-OPERATIVE DIAGNOSIS:  STENOSING TENOSYNOVITIS LEFT INDEX AND LEFT LONG FINGERS   PROCEDURE:  Procedure(s): RELEASE A-1 PULLEYS LEFT INDEX AND LEFT LONG FINGERS  (Left)  SURGEON:  Surgeon(s) and Role:    * Daryll Brod, MD - Primary  PHYSICIAN ASSISTANT:   ASSISTANTS: none   ANESTHESIA:   local and regional  EBL:  Total I/O In: 500 [I.V.:500] Out: -   BLOOD ADMINISTERED:none  DRAINS: none   LOCAL MEDICATIONS USED:  BUPIVICAINE   SPECIMEN:  No Specimen  DISPOSITION OF SPECIMEN:  N/A  COUNTS:  YES  TOURNIQUET:   Total Tourniquet Time Documented: Forearm (Left) - 23 minutes Total: Forearm (Left) - 23 minutes   DICTATION: .Other Dictation: Dictation Number [id number]  [name] OF CARE: Discharge to home after PACU  PATIENT DISPOSITION:  PACU - hemodynamically stable.

## 2014-04-30 NOTE — Anesthesia Procedure Notes (Signed)
Procedure Name: MAC Performed by: Terrance Mass Pre-anesthesia Checklist: Patient identified, Timeout performed, Emergency Drugs available, Suction available and Patient being monitored Patient Re-evaluated:Patient Re-evaluated prior to inductionOxygen Delivery Method: Simple face mask Placement Confirmation: positive ETCO2 Dental Injury: Teeth and Oropharynx as per pre-operative assessment

## 2014-04-30 NOTE — Op Note (Signed)
Dictation Number [id number]

## 2014-04-30 NOTE — Anesthesia Preprocedure Evaluation (Addendum)
Anesthesia Evaluation  Patient identified by MRN, date of birth, ID band Patient awake    Reviewed: Allergy & Precautions, H&P , NPO status , Patient's Chart, lab work & pertinent test results  History of Anesthesia Complications Negative for: history of anesthetic complications  Airway Mallampati: II  TM Distance: >3 FB Neck ROM: Full    Dental  (+) Missing, Dental Advisory Given   Pulmonary neg pulmonary ROS,    Pulmonary exam normal       Cardiovascular hypertension, On Home Beta Blockers + CAD, + Past MI and + CABG     Neuro/Psych Anxiety negative neurological ROS     GI/Hepatic negative GI ROS, Neg liver ROS,   Endo/Other  diabetes  Renal/GU negative Renal ROS  negative genitourinary   Musculoskeletal   Abdominal   Peds  Hematology CLL   Anesthesia Other Findings   Reproductive/Obstetrics                            Anesthesia Physical Anesthesia Plan  ASA: III  Anesthesia Plan: MAC and Bier Block   Post-op Pain Management:    Induction:   Airway Management Planned: Simple Face Mask  Additional Equipment:   Intra-op Plan:   Post-operative Plan:   Informed Consent: I have reviewed the patients History and Physical, chart, labs and discussed the procedure including the risks, benefits and alternatives for the proposed anesthesia with the patient or authorized representative who has indicated his/her understanding and acceptance.   Dental advisory given  Plan Discussed with: CRNA, Anesthesiologist and Surgeon  Anesthesia Plan Comments:        Anesthesia Quick Evaluation

## 2014-04-30 NOTE — Discharge Instructions (Addendum)

## 2014-04-30 NOTE — Anesthesia Postprocedure Evaluation (Signed)
Anesthesia Post Note  Patient: [name]  Procedure(s) Performed: Procedure(s) (LRB): RELEASE A-1 PULLEYS LEFT INDEX AND LEFT LONG FINGERS  (Left)  Anesthesia type: MAC  Patient location: PACU  Post pain: Pain level controlled  Post assessment: Patient's Cardiovascular Status Stable  Last Vitals:  Filed Vitals:   04/30/14 1441  BP: 129/69  Pulse: 63  Temp: 36.9 C  Resp: 16    Post vital signs: Reviewed and stable  Level of consciousness: sedated  Complications: No apparent anesthesia complications

## 2014-05-01 NOTE — Op Note (Signed)
[name], [name] NO.:  [id number]  MEDICAL RECORD NO.:  [id number]  LOCATION:                                 FACILITY:  PHYSICIAN:  Daryll Brod, M.D.            DATE OF BIRTH:  DATE OF PROCEDURE:  04/28/2014 DATE OF DISCHARGE:                              OPERATIVE REPORT   PREOPERATIVE DIAGNOSIS:  Stenosing tenosynovitis, left index, left middle finger.  POSTOPERATIVE DIAGNOSIS:  Stenosing tenosynovitis, left index, left middle finger.  OPERATION:  Release of A1 pulley, left index, left middle finger.  SURGEON:  Daryll Brod, M.D.  ANESTHESIA:  Forearm-based IV regional with local infiltration.  HISTORY:  The patient is a 74 year old male with a history of triggering of his index and middle fingers, left hand.  This has not responded to conservative treatment.  He was elected to undergo surgical release. Pre, peri, and postoperative course have been discussed along with risks and complications.  He was aware that there was no guarantee with the surgery; possibility of infection; recurrence of injury to arteries, nerves, tendons; incomplete relief of symptoms and dystrophy.  In the preoperative area, the patient was seen, the extremity marked by both patient and surgeon, and antibiotic given.  PROCEDURE IN DETAIL:  The patient was brought to the operating room where a forearm-based IV regional anesthetic was carried out without difficulty.  He was prepped using ChloraPrep, supine position with the left arm free.  A 3-minute dry time was allowed.  Time-out taken, confirming the patient and procedure.  An oblique incision was made over the index finger, left hand, carried down through the subcutaneous tissue.  The A1 pulley was identified, found to be thickened along with a very significant tenosynovitis proximally.  The A1 pulley was released on its radial aspect.  A small incision was made centrally in A2. Partial tenosynovectomy was performed  proximally.  The finger was placed through a full range of motion, no further triggering was noted.  A separate incision was then made over the middle finger, obliquely in nature, again carried down through the subcutaneous tissue. Neurovascular structure was again protected.  The A1 pulley released on its radial aspect.  Partial tenosynovectomy was performed proximally.  A small incision was made centrally in A2.  The finger placed through full range of motion, no further triggering was noted.  The wounds were irrigated and closed with interrupted 5-0 nylon suture with local infiltration with 0.25% bupivacaine without epinephrine was given, approximately 7 mL was used.  Sterile compressive dressing with fingers free was applied.  On deflation of the tourniquet, all fingers were immediately pinked.  He was taken to the recovery room for observation in satisfactory condition.  He will be discharged to home to return to the [facility] in 1 week, on [place].          ______________________________ Daryll Brod, M.D.     GK/MEDQ  D:  04/30/2014  T:  05/01/2014  Job:  [id number]

## 2014-05-03 ENCOUNTER — Encounter (HOSPITAL_BASED_OUTPATIENT_CLINIC_OR_DEPARTMENT_OTHER): Payer: Self-pay | Admitting: Orthopedic Surgery

## 2014-05-12 ENCOUNTER — Other Ambulatory Visit: Payer: Self-pay | Admitting: *Deleted

## 2014-05-12 DIAGNOSIS — C9110 Chronic lymphocytic leukemia of B-cell type not having achieved remission: Secondary | ICD-10-CM

## 2014-05-13 ENCOUNTER — Other Ambulatory Visit: Payer: Medicare Other

## 2014-05-13 ENCOUNTER — Other Ambulatory Visit (HOSPITAL_BASED_OUTPATIENT_CLINIC_OR_DEPARTMENT_OTHER): Payer: Medicare Other

## 2014-05-13 DIAGNOSIS — C9110 Chronic lymphocytic leukemia of B-cell type not having achieved remission: Secondary | ICD-10-CM

## 2014-05-13 LAB — CBC WITH DIFFERENTIAL/PLATELET
BASO%: 0.1 % (ref 0.0–2.0)
BASOS ABS: 0.3 10*3/uL — AB (ref 0.0–0.1)
EOS ABS: 0.2 10*3/uL (ref 0.0–0.5)
EOS%: 0.1 % (ref 0.0–7.0)
HEMATOCRIT: 41.5 % (ref 38.4–49.9)
HGB: 12.2 g/dL — ABNORMAL LOW (ref 13.0–17.1)
LYMPH#: 186.3 10*3/uL — AB (ref 0.9–3.3)
LYMPH%: 93.4 % — ABNORMAL HIGH (ref 14.0–49.0)
MCH: 26 pg — ABNORMAL LOW (ref 27.2–33.4)
MCHC: 29.5 g/dL — ABNORMAL LOW (ref 32.0–36.0)
MCV: 88.1 fL (ref 79.3–98.0)
MONO#: 4.7 10*3/uL — AB (ref 0.1–0.9)
MONO%: 2.3 % (ref 0.0–14.0)
NEUT%: 4.1 % — ABNORMAL LOW (ref 39.0–75.0)
NEUTROS ABS: 8.1 10*3/uL — AB (ref 1.5–6.5)
Platelets: 147 10*3/uL (ref 140–400)
RBC: 4.71 10*6/uL (ref 4.20–5.82)
RDW: 15.9 % — AB (ref 11.0–14.6)
WBC: 199.5 10*3/uL (ref 4.0–10.3)

## 2014-05-13 LAB — COMPREHENSIVE METABOLIC PANEL (CC13)
ALT: 9 U/L (ref 0–55)
AST: 15 U/L (ref 5–34)
Albumin: 4.2 g/dL (ref 3.5–5.0)
Alkaline Phosphatase: 95 U/L (ref 40–150)
Anion Gap: 14 mEq/L — ABNORMAL HIGH (ref 3–11)
BUN: 17.4 mg/dL (ref 7.0–26.0)
CO2: 22 meq/L (ref 22–29)
Calcium: 9.7 mg/dL (ref 8.4–10.4)
Chloride: 106 mEq/L (ref 98–109)
Creatinine: 1.3 mg/dL (ref 0.7–1.3)
EGFR: 53 mL/min/{1.73_m2} — AB (ref >90)
GLUCOSE: 140 mg/dL (ref 70–140)
Potassium: 4.5 mEq/L (ref 3.5–5.1)
SODIUM: 142 meq/L (ref 136–145)
TOTAL PROTEIN: 6.7 g/dL (ref 6.4–8.3)
Total Bilirubin: 1.46 mg/dL — ABNORMAL HIGH (ref 0.20–1.20)

## 2014-05-13 LAB — TECHNOLOGIST REVIEW

## 2014-05-13 LAB — LACTATE DEHYDROGENASE (CC13): LDH: 171 U/L (ref 125–245)

## 2014-07-19 ENCOUNTER — Other Ambulatory Visit: Payer: Self-pay

## 2014-08-03 ENCOUNTER — Telehealth: Payer: Self-pay | Admitting: Oncology

## 2014-08-03 NOTE — Telephone Encounter (Signed)
Patient wife called and stated she has not recieved any call back for patient to get appointment due to blood count is low per PCP. I informed patient we have to recieve order by the MD before we can schedule any appointment. I then trasnfer patient to t

## 2014-08-10 ENCOUNTER — Other Ambulatory Visit: Payer: Medicare Other

## 2014-08-17 ENCOUNTER — Other Ambulatory Visit: Payer: Medicare Other

## 2014-08-25 ENCOUNTER — Ambulatory Visit: Payer: Medicare Other | Admitting: Oncology

## 2014-08-25 ENCOUNTER — Other Ambulatory Visit: Payer: Medicare Other

## 2014-09-06 ENCOUNTER — Other Ambulatory Visit: Payer: Self-pay | Admitting: *Deleted

## 2014-09-06 DIAGNOSIS — C9110 Chronic lymphocytic leukemia of B-cell type not having achieved remission: Secondary | ICD-10-CM

## 2014-09-07 ENCOUNTER — Telehealth: Payer: Self-pay | Admitting: Oncology

## 2014-09-07 ENCOUNTER — Other Ambulatory Visit (HOSPITAL_BASED_OUTPATIENT_CLINIC_OR_DEPARTMENT_OTHER): Payer: Medicare Other

## 2014-09-07 ENCOUNTER — Ambulatory Visit (HOSPITAL_BASED_OUTPATIENT_CLINIC_OR_DEPARTMENT_OTHER): Payer: Medicare Other | Admitting: Oncology

## 2014-09-07 ENCOUNTER — Encounter: Payer: Self-pay | Admitting: Oncology

## 2014-09-07 VITALS — BP 112/56 | HR 64 | Temp 97.8°F | Resp 18 | Ht 65.0 in | Wt 157.5 lb

## 2014-09-07 DIAGNOSIS — D649 Anemia, unspecified: Secondary | ICD-10-CM

## 2014-09-07 DIAGNOSIS — C9110 Chronic lymphocytic leukemia of B-cell type not having achieved remission: Secondary | ICD-10-CM

## 2014-09-07 LAB — COMPREHENSIVE METABOLIC PANEL (CC13)
ALBUMIN: 4.1 g/dL (ref 3.5–5.0)
ALK PHOS: 71 U/L (ref 40–150)
ALT: 13 U/L (ref 0–55)
AST: 14 U/L (ref 5–34)
Anion Gap: 9 mEq/L (ref 3–11)
BILIRUBIN TOTAL: 1.41 mg/dL — AB (ref 0.20–1.20)
BUN: 19.3 mg/dL (ref 7.0–26.0)
CO2: 27 meq/L (ref 22–29)
CREATININE: 1.4 mg/dL — AB (ref 0.7–1.3)
Calcium: 10.3 mg/dL (ref 8.4–10.4)
Chloride: 107 mEq/L (ref 98–109)
EGFR: 48 mL/min/{1.73_m2} — ABNORMAL LOW (ref >90)
Glucose: 97 mg/dl (ref 70–140)
Potassium: 4.6 mEq/L (ref 3.5–5.1)
Sodium: 143 mEq/L (ref 136–145)
TOTAL PROTEIN: 6.3 g/dL — AB (ref 6.4–8.3)

## 2014-09-07 LAB — CBC WITH DIFFERENTIAL/PLATELET
HEMATOCRIT: 39 % (ref 38.4–49.9)
HEMOGLOBIN: 11.6 g/dL — AB (ref 13.0–17.1)
MCH: 26.9 pg — AB (ref 27.2–33.4)
MCHC: 29.8 g/dL — ABNORMAL LOW (ref 32.0–36.0)
MCV: 90.4 fL (ref 79.3–98.0)
Platelets: 142 10*3/uL (ref 140–400)
RBC: 4.32 10*6/uL (ref 4.20–5.82)
RDW: 16.5 % — ABNORMAL HIGH (ref 11.0–14.6)
WBC: 196.7 10*3/uL — AB (ref 4.0–10.3)

## 2014-09-07 LAB — MANUAL DIFFERENTIAL
ALC: 188.8 10*3/uL — ABNORMAL HIGH (ref 0.9–3.3)
ANC (CHCC manual diff): 7.9 10*3/uL — ABNORMAL HIGH (ref 1.5–6.5)
BAND NEUTROPHILS: 0 % (ref 0–10)
BASOPHIL: 0 % (ref 0–2)
Blasts: 0 % (ref 0–0)
EOS%: 0 % (ref 0–7)
LYMPH: 96 % — ABNORMAL HIGH (ref 14–49)
METAMYELOCYTES PCT: 0 % (ref 0–0)
MONO: 0 % (ref 0–14)
Myelocytes: 0 % (ref 0–0)
NRBC: 0 % (ref 0–0)
Other Cell: 0 % (ref 0–0)
PLT EST: DECREASED
PROMYELO: 0 % (ref 0–0)
RBC COMMENTS: NORMAL
SEG: 4 % — ABNORMAL LOW (ref 38–77)
Variant Lymph: 0 % (ref 0–0)

## 2014-09-07 NOTE — Telephone Encounter (Signed)
Appointments made and avs printed for patient °

## 2014-09-07 NOTE — Progress Notes (Signed)
ID: [name]   DOB: 09/26/1940  MR#: [id number]  ZDG#:[id number]  PCP: Javier Pac, MD GYN: SU:  OTHER MD:  HISTORY OF PRESENT ILLNESS: From the earlier summary notes:  The patient had had multiple liver function tests and electrolytes, but no complete blood count since 1998 (that one apparently was normal) until he was referred to [facility] for evaluation of muscle aches, possibly related to his cholesterol-lowering medication.  As part of the workup, Dr. Rockwell [name] obtained a complete blood count on Jun 15, 2002, which showed a hemoglobin of 13.3, MCV 79.5, platelets 178,000 and a white cell count of 37.3, with 80% lymphocytes  The absolute lymphocyte count was 30,000.   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 The interval history is generally unremarkable. I did not see him last year, so was not aware that his wife had died 12/04/12. He now lives by himself. He walks 2-3 miles every day. From a lymphoma/leukemia point of view he denies fevers, adenopathy, unexplained fatigue or unexplained weight loss. There has been no rash or pruritus.  REVIEW OF SYSTEMS: Asked what his "worse problem" is, he tells me he has "no worse problems". A detailed review of systems today was benign   PAST MEDICAL HISTORY: Past Medical History  Diagnosis Date  . 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 Gout, unspecified 12/22/2010  . Diabetes mellitus without complication     Type II  . MI (myocardial infarction) 1998  . Lymphocytic leukemia     Chronic followed by Dr [name] at [facility]  . Anxiety   . Dysplastic nevus 01/1996    melanoma 2013 followed by dermatologist Dr [name]  . Mixed dyslipidemia   . Hypertension   . Coronary artery disease     s/p CABG 1998  . Wears partial dentures   Significant for hypertension, diabetes, hypercholesterolemia, coronary artery disease status  post coronary artery bypass graft in 1998, history of septoplasty, history of sleep apnea, which apparently has resolved with aggressive treatment of his rhinitis, history of colonic polyps, history of peptic ulcer disease and history of remote and minimal tobacco abuse.   PAST SURGICAL HISTORY: Past Surgical History  Procedure Laterality Date  . Coronary artery bypass graft  1998    X7  . Cholecystectomy  2010  . Hernia repair  2010    rting hernia  . Colonoscopy    . Cardiac catheterization    . Trigger finger release Left 04/30/2014    Procedure: RELEASE A-1 PULLEYS LEFT INDEX AND LEFT LONG FINGERS ;  Surgeon: Javier Brod, MD;  Location: [place];  Service: Orthopedics;  Laterality: Left;    FAMILY HISTORY [name]'s father died at the age of 25 from "old age" according to [name].  His mother died at 66 with a cerebrovascular accident.  She had many children, of whom 12 made it to adulthood.  All of his brothers older than he (with 1 exception) have had prostate cancer.    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 His wife of >40 years, [name], died 04-Dec-2012. [name] currently lives alone.. He has 2 children surviving.  One son was born with hyaline membrane and died suddenly at the age of 55, about 5 years ago.  Son [name] teaches poly-sci in [place]. He has a daughter, the patient's only grandchild,  currently 58 years old. Son [name] lives out [place]. He has significant psychosocial issues not documented here. The Ropers are [employer]ons.    ADVANCED DIRECTIVES: in place  HEALTH MAINTENANCE: Social History  Substance Use Topics  . Smoking status: Never Smoker   . Smokeless tobacco: Not on file  . Alcohol Use: No     Colonoscopy:  PSA: <4 per patient's report  Bone density:  Lipid panel:  No Known Allergies  Current Outpatient Prescriptions  Medication Sig Dispense Refill  . allopurinol (ZYLOPRIM)  300 MG tablet Take 1 tablet (300 mg total) by mouth daily. 90 tablet 12  . ALPRAZolam (XANAX) 1 MG tablet Take 1 mg by mouth at bedtime as needed.    Marland Kitchen aspirin 81 MG tablet Take 81 mg by mouth daily.    Marland Kitchen atorvastatin (LIPITOR) 40 MG tablet Take 40 mg by mouth daily.    Marland Kitchen HYDROcodone-acetaminophen (NORCO) 5-325 MG per tablet Take 1 tablet by mouth every 6 (six) hours as needed for moderate pain. 30 tablet 0  . IRON, FERROUS GLUCONATE, PO Take by mouth. 365    . metFORMIN (GLUCOPHAGE) 500 MG tablet Take 500 mg by mouth 2 (two) times daily with a meal.    . metoprolol succinate (TOPROL-XL) 50 MG 24 hr tablet Take 50 mg by mouth daily. Take with or immediately following a meal.    . nateglinide (STARLIX) 60 MG tablet Take 60 mg by mouth 3 (three) times daily before meals.    . quinapril (ACCUPRIL) 10 MG tablet Take 10 mg by mouth at bedtime.     No current facility-administered medications for this visit.    OBJECTIVE: Middle-aged white male who appears stated age 74 Vitals:   09/07/14 1029  BP: 112/56  Pulse: 64  Temp: 97.8 F (36.6 C)  Resp: 18     Body mass index is 26.21 kg/(m^2).    ECOG FS: 0  Sclerae unicteric, pupils round and equal Oropharynx clear and moist-- no thrush or other lesions No cervical or supraclavicular adenopathy; no axillary or inguinal adenopathy Lungs no rales or rhonchi Heart regular rate and rhythm Abd soft, nontender, positive bowel sounds; the tip of the spleen is palpable 1 hands breath below the rib cage at the left midclavicular line MSK no focal spinal tenderness, no upper extremity lymphedema Neuro: nonfocal, well oriented, appropriate affect   LAB RESULTS: Lab Results  Component Value Date   WBC 196.7* 09/07/2014   NEUTROABS 8.1* 05/13/2014   HGB 11.6* 09/07/2014   HCT 39.0 09/07/2014   MCV 90.4 09/07/2014   PLT 142 09/07/2014      Chemistry      Component Value Date/Time   NA 143 09/07/2014 1017   NA 140 04/28/2014 1535   K 4.6  09/07/2014 1017   K 4.2 04/28/2014 1535   CL 104 04/28/2014 1535   CL 109* 07/08/2012 1321   CO2 27 09/07/2014 1017   CO2 27 04/28/2014 1535   BUN 19.3 09/07/2014 1017   BUN 17 04/28/2014 1535   CREATININE 1.4* 09/07/2014 1017   CREATININE 1.50* 04/28/2014 1535      Component Value Date/Time   CALCIUM 10.3 09/07/2014 1017   CALCIUM 9.2 04/28/2014 1535   ALKPHOS 71 09/07/2014 1017   ALKPHOS 62 10/20/2013 0834   AST 14 09/07/2014 1017   AST 19 10/20/2013 0834   ALT 13 09/07/2014 1017   ALT 13 10/20/2013 0834   BILITOT 1.41* 09/07/2014 1017   BILITOT 1.3* 10/20/2013 1610  No results found for: LABCA2  No components found for: CHENI778  No results for input(s): INR in the last 168 hours.  Urinalysis    Component Value Date/Time   LABSPEC 1.010 04/24/2007 1443   PHURINE 6.5 04/24/2007 1443   HGBUR Trace 04/24/2007 1443   BILIRUBINUR Negative 04/24/2007 1443   KETONESUR Negative 04/24/2007 1443   PROTEINUR Negative 04/24/2007 1443   NITRITE Negative 04/24/2007 1443   LEUKOCYTESUR Negative 04/24/2007 1443   Results for ABDULHAMID, OLGIN (MRN [id number]) as of 09/07/2014 10:57  Ref. Range 11/24/2012 13:27 08/04/2013 11:03 11/05/2013 11:19 02/11/2014 10:18 05/13/2014 11:13  lymph# Latest Ref Range: 0.9-3.3 10e3/uL 214.1 (H) 181.9 (H) 174.7 (H) 193.1 (H) 186.3 (H)   STUDIES: No results found.   ASSESSMENT: 74 y.o. [place] man with a history of chronic lymphoid leukemia initially diagnosed in May 2004.  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  He has notr accepted treatment so far.   [name]:  [name] is doing fine and essentially asymptomatic. His anemia is very minimal. At this point there is no strong indication for treatment and he desires no intervention.  We're going to continue to follow his lab work every 3 months. We will check his immunoglobulins every 6 months.  He  keeps up with current literature and is unaware of ibrutinib and other new treatments for chronic lymphoid leukemia, which are increasingly effective and well tolerated, although on the expensive side. His interest in these options is purely academic  He will see me again in one year. He knows to call for any problems that may develop before that visit. Chauncey Cruel, [place] [phone] 09/07/2014

## 2014-10-31 IMAGING — US US EXTREM LOW VENOUS*L*
1 series · 14 of 24 positions shown · non-contrast
Comparison: None.

CLINICAL DATA: Left calf swelling and pain

LEFT LOWER EXTREMITY VENOUS DUPLEX ULTRASOUND
TECHNIQUE: Gray-scale sonography with graded compression, as well
as color Doppler and duplex ultrasound, were performed to evaluate
the deep venous system of the lower extremity from the level of the
common femoral vein through the popliteal and proximal calf veins.
Spectral Doppler was utilized to evaluate flow at rest and with
distal augmentation maneuvers.

[Series 1: us extrem low venous*left* · 14 of 31 slices shown]
[im 1/31]
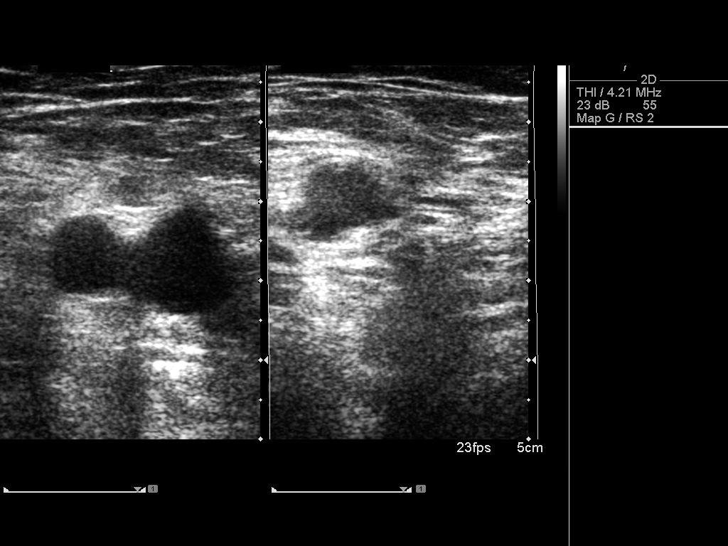
[im 3/31]
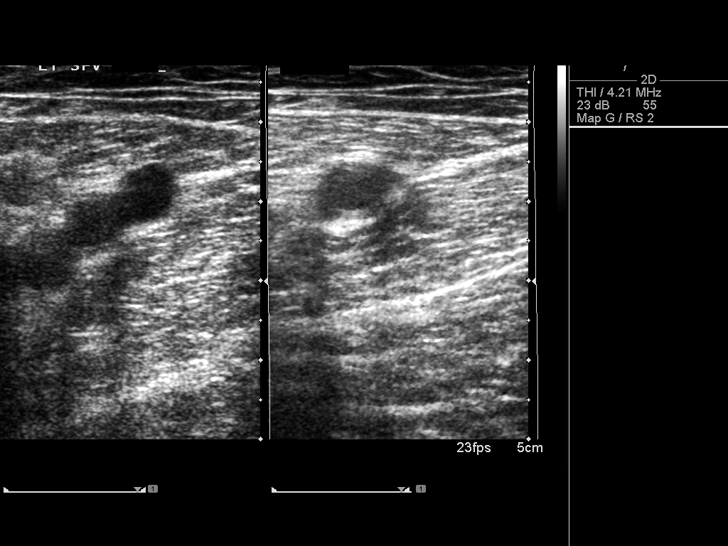
[im 6/31]
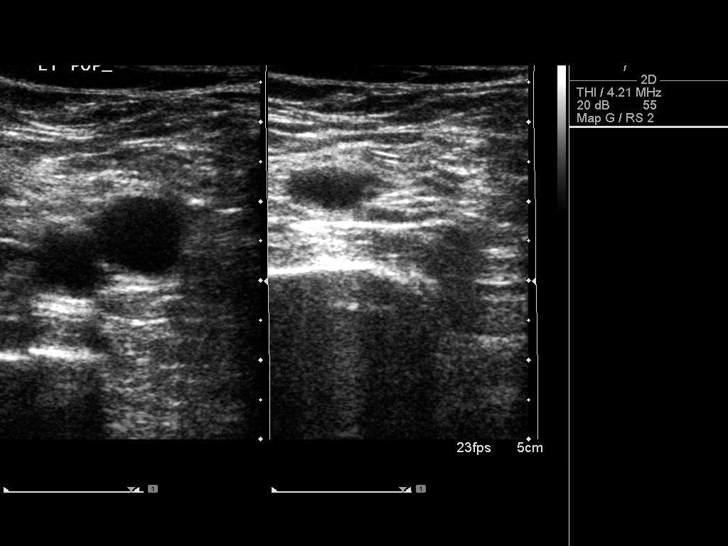
[im 8/31]
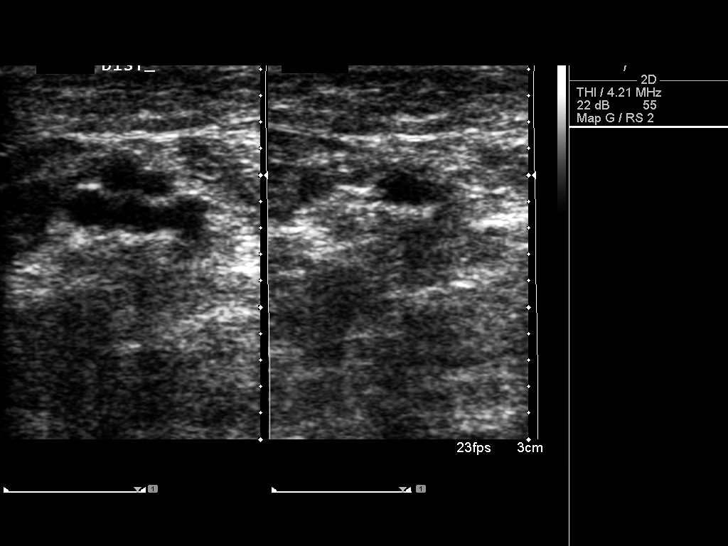
[im 10/31]
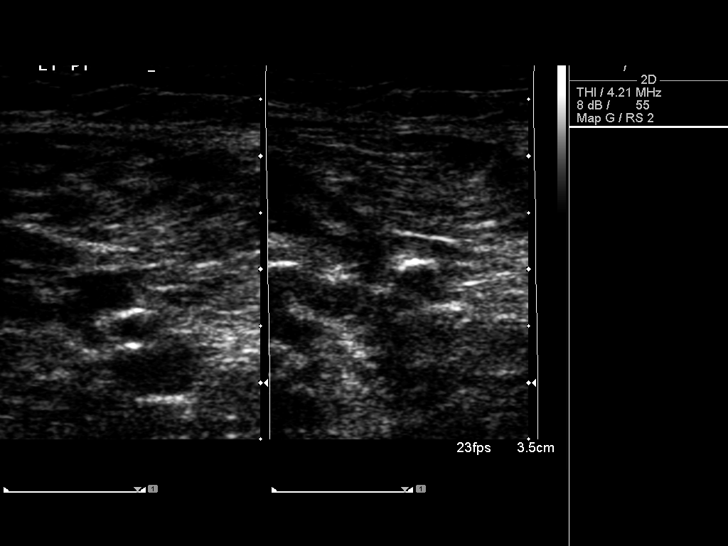
[im 12/31]
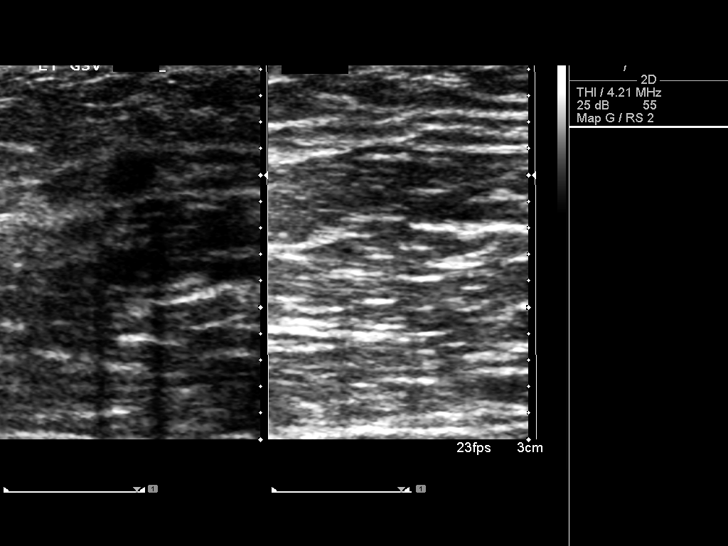
[im 15/31]
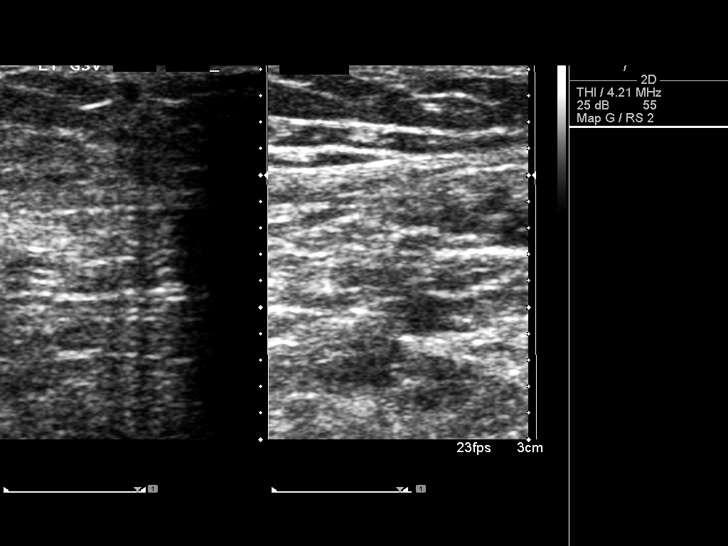
[im 16/31]
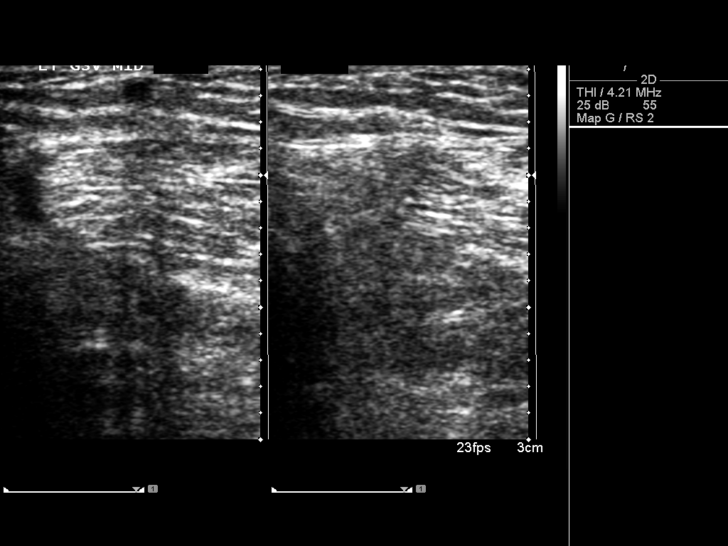
[im 19/31]
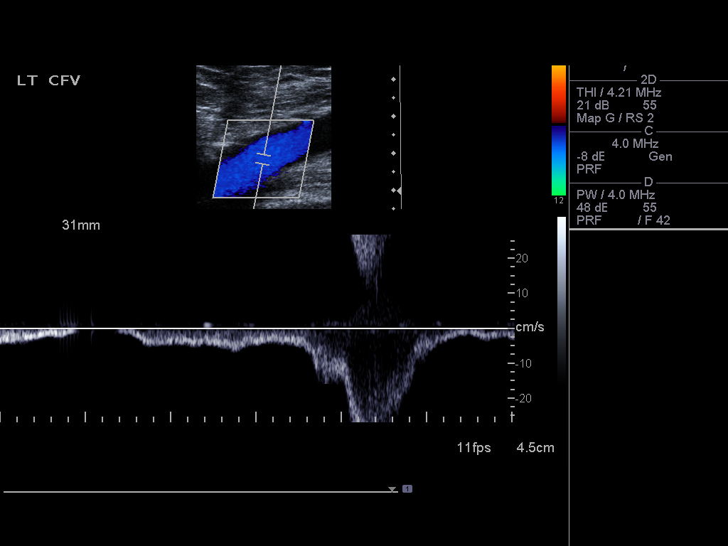
[im 21/31]
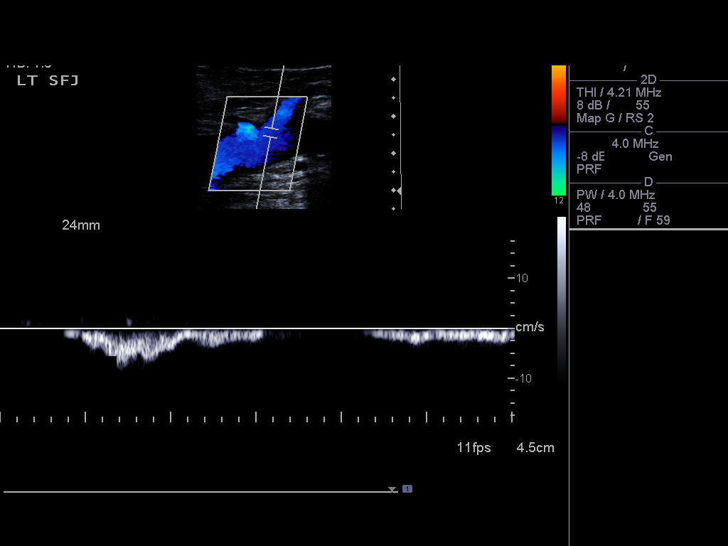
[im 24/31]
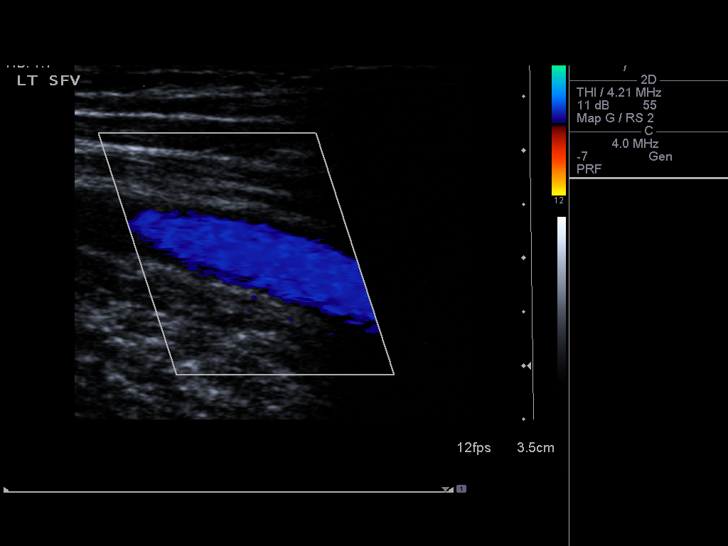
[im 25/31]
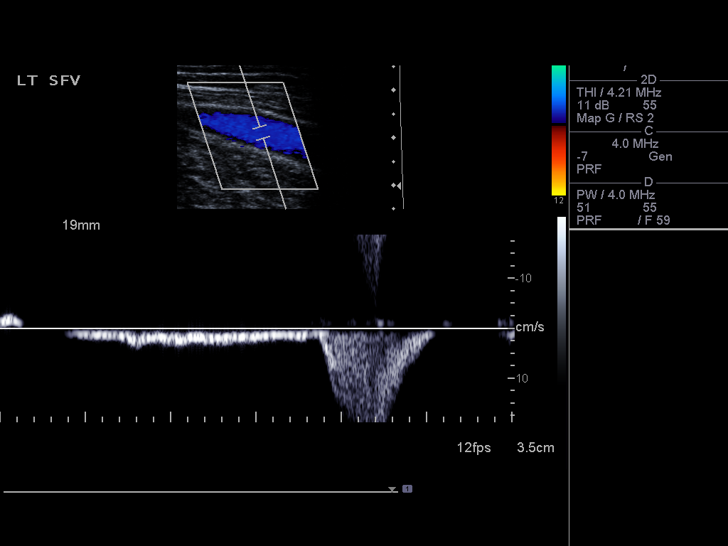
[im 28/31]
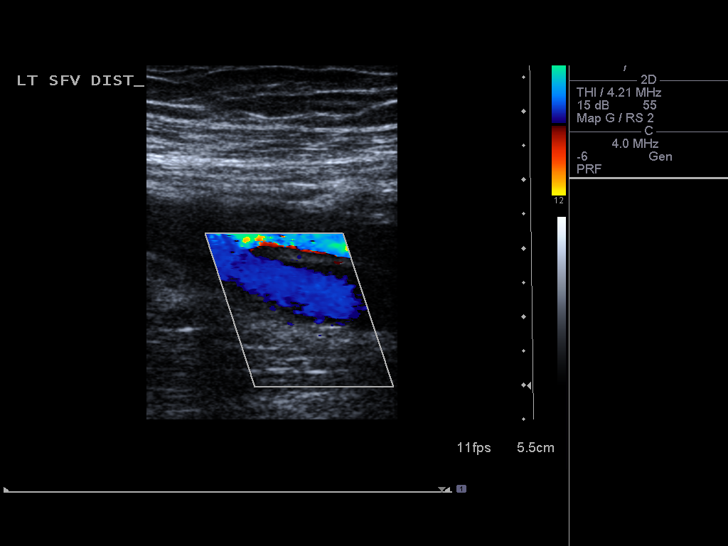
[im 31/31]
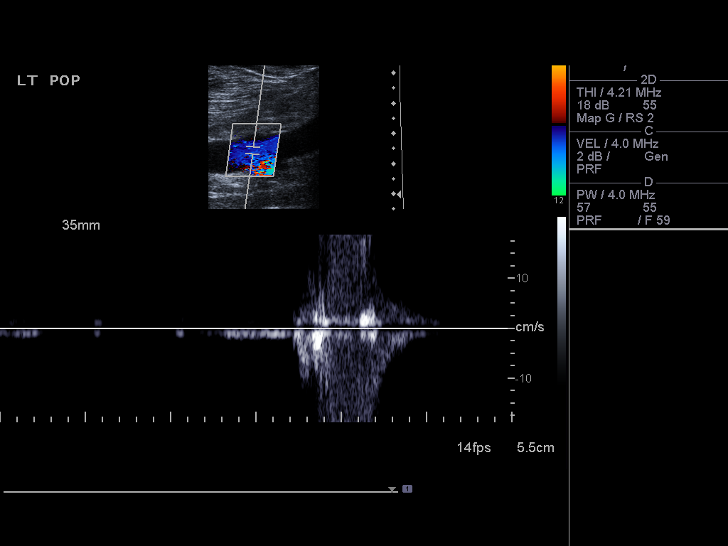

[14 of 24 positions shown; findings below may reference images not displayed]

FINDINGS: There is complete compressibility of the left common
femoral, femoral, and popliteal veins.  Doppler analysis
demonstrates respiratory phasicity and augmentation of flow upon
calf compression.
IMPRESSION: No evidence of left lower extremity DVT.

## 2014-12-07 ENCOUNTER — Other Ambulatory Visit (HOSPITAL_BASED_OUTPATIENT_CLINIC_OR_DEPARTMENT_OTHER): Payer: Medicare Other

## 2014-12-07 ENCOUNTER — Encounter: Payer: Self-pay | Admitting: Oncology

## 2014-12-07 DIAGNOSIS — C9110 Chronic lymphocytic leukemia of B-cell type not having achieved remission: Secondary | ICD-10-CM | POA: Diagnosis present

## 2014-12-07 LAB — CBC WITH DIFFERENTIAL/PLATELET
BASO%: 0.1 % (ref 0.0–2.0)
BASOS ABS: 0.2 10*3/uL — AB (ref 0.0–0.1)
EOS%: 0.1 % (ref 0.0–7.0)
Eosinophils Absolute: 0.2 10*3/uL (ref 0.0–0.5)
HCT: 39.9 % (ref 38.4–49.9)
HEMOGLOBIN: 11.7 g/dL — AB (ref 13.0–17.1)
LYMPH%: 93.3 % — AB (ref 14.0–49.0)
MCH: 26.2 pg — AB (ref 27.2–33.4)
MCHC: 29.2 g/dL — AB (ref 32.0–36.0)
MCV: 89.8 fL (ref 79.3–98.0)
MONO#: 4.8 10*3/uL — ABNORMAL HIGH (ref 0.1–0.9)
MONO%: 2.8 % (ref 0.0–14.0)
NEUT#: 6.3 10*3/uL (ref 1.5–6.5)
NEUT%: 3.7 % — AB (ref 39.0–75.0)
Platelets: 124 10*3/uL — ABNORMAL LOW (ref 140–400)
RBC: 4.45 10*6/uL (ref 4.20–5.82)
RDW: 15.7 % — AB (ref 11.0–14.6)
WBC: 169 10*3/uL (ref 4.0–10.3)
lymph#: 157.5 10*3/uL — ABNORMAL HIGH (ref 0.9–3.3)

## 2014-12-07 LAB — COMPREHENSIVE METABOLIC PANEL (CC13)
ALBUMIN: 4.1 g/dL (ref 3.5–5.0)
ALK PHOS: 70 U/L (ref 40–150)
ALT: 12 U/L (ref 0–55)
AST: 18 U/L (ref 5–34)
Anion Gap: 9 mEq/L (ref 3–11)
BILIRUBIN TOTAL: 1.58 mg/dL — AB (ref 0.20–1.20)
BUN: 19 mg/dL (ref 7.0–26.0)
CO2: 27 meq/L (ref 22–29)
CREATININE: 1.4 mg/dL — AB (ref 0.7–1.3)
Calcium: 9.5 mg/dL (ref 8.4–10.4)
Chloride: 107 mEq/L (ref 98–109)
EGFR: 49 mL/min/{1.73_m2} — AB (ref >90)
GLUCOSE: 139 mg/dL (ref 70–140)
Potassium: 4.3 mEq/L (ref 3.5–5.1)
SODIUM: 143 meq/L (ref 136–145)
TOTAL PROTEIN: 6.4 g/dL (ref 6.4–8.3)

## 2014-12-07 LAB — LACTATE DEHYDROGENASE (CC13): LDH: 178 U/L (ref 125–245)

## 2014-12-07 LAB — TECHNOLOGIST REVIEW

## 2014-12-07 NOTE — Progress Notes (Signed)
Critical lab today:  WBC- 169.0  Dr. Jana Hakim aware.

## 2014-12-08 LAB — IGG, IGA, IGM
IGM, SERUM: 5 mg/dL — AB (ref 41–251)
IgA: 39 mg/dL — ABNORMAL LOW (ref 68–379)
IgG (Immunoglobin G), Serum: 388 mg/dL — ABNORMAL LOW (ref 650–1600)

## 2015-01-28 DIAGNOSIS — R3912 Poor urinary stream: Secondary | ICD-10-CM | POA: Diagnosis not present

## 2015-01-28 DIAGNOSIS — Z0000 Encounter for general adult medical examination without abnormal findings: Secondary | ICD-10-CM | POA: Diagnosis not present

## 2015-01-28 DIAGNOSIS — N4231 Prostatic intraepithelial neoplasia: Secondary | ICD-10-CM | POA: Diagnosis not present

## 2015-01-28 DIAGNOSIS — N400 Enlarged prostate without lower urinary tract symptoms: Secondary | ICD-10-CM | POA: Diagnosis not present

## 2015-01-28 DIAGNOSIS — R972 Elevated prostate specific antigen [PSA]: Secondary | ICD-10-CM | POA: Diagnosis not present

## 2015-02-08 DIAGNOSIS — L821 Other seborrheic keratosis: Secondary | ICD-10-CM | POA: Diagnosis not present

## 2015-02-08 DIAGNOSIS — L570 Actinic keratosis: Secondary | ICD-10-CM | POA: Diagnosis not present

## 2015-02-08 DIAGNOSIS — Z85820 Personal history of malignant melanoma of skin: Secondary | ICD-10-CM | POA: Diagnosis not present

## 2015-02-08 DIAGNOSIS — Z85828 Personal history of other malignant neoplasm of skin: Secondary | ICD-10-CM | POA: Diagnosis not present

## 2015-02-08 DIAGNOSIS — L739 Follicular disorder, unspecified: Secondary | ICD-10-CM | POA: Diagnosis not present

## 2015-05-13 DIAGNOSIS — Z7984 Long term (current) use of oral hypoglycemic drugs: Secondary | ICD-10-CM | POA: Diagnosis not present

## 2015-05-13 DIAGNOSIS — D720 Genetic anomalies of leukocytes: Secondary | ICD-10-CM | POA: Diagnosis not present

## 2015-05-13 DIAGNOSIS — Z9889 Other specified postprocedural states: Secondary | ICD-10-CM | POA: Diagnosis not present

## 2015-05-13 DIAGNOSIS — Z7982 Long term (current) use of aspirin: Secondary | ICD-10-CM | POA: Diagnosis not present

## 2015-05-13 DIAGNOSIS — C9110 Chronic lymphocytic leukemia of B-cell type not having achieved remission: Secondary | ICD-10-CM | POA: Diagnosis not present

## 2015-05-13 DIAGNOSIS — R161 Splenomegaly, not elsewhere classified: Secondary | ICD-10-CM | POA: Diagnosis not present

## 2015-05-13 DIAGNOSIS — Z85828 Personal history of other malignant neoplasm of skin: Secondary | ICD-10-CM | POA: Diagnosis not present

## 2015-05-13 DIAGNOSIS — E785 Hyperlipidemia, unspecified: Secondary | ICD-10-CM | POA: Diagnosis not present

## 2015-05-13 DIAGNOSIS — Z79899 Other long term (current) drug therapy: Secondary | ICD-10-CM | POA: Diagnosis not present

## 2015-05-13 DIAGNOSIS — I10 Essential (primary) hypertension: Secondary | ICD-10-CM | POA: Diagnosis not present

## 2015-05-13 DIAGNOSIS — Z85820 Personal history of malignant melanoma of skin: Secondary | ICD-10-CM | POA: Diagnosis not present

## 2015-05-13 DIAGNOSIS — E119 Type 2 diabetes mellitus without complications: Secondary | ICD-10-CM | POA: Diagnosis not present

## 2015-05-13 DIAGNOSIS — E790 Hyperuricemia without signs of inflammatory arthritis and tophaceous disease: Secondary | ICD-10-CM | POA: Diagnosis not present

## 2015-06-07 ENCOUNTER — Other Ambulatory Visit (HOSPITAL_BASED_OUTPATIENT_CLINIC_OR_DEPARTMENT_OTHER): Payer: PPO

## 2015-06-07 DIAGNOSIS — C9110 Chronic lymphocytic leukemia of B-cell type not having achieved remission: Secondary | ICD-10-CM

## 2015-06-07 LAB — TECHNOLOGIST REVIEW

## 2015-06-07 LAB — COMPREHENSIVE METABOLIC PANEL
ALT: 11 U/L (ref 0–55)
AST: 14 U/L (ref 5–34)
Albumin: 4.1 g/dL (ref 3.5–5.0)
Alkaline Phosphatase: 74 U/L (ref 40–150)
Anion Gap: 7 mEq/L (ref 3–11)
BUN: 20.1 mg/dL (ref 7.0–26.0)
CALCIUM: 9.9 mg/dL (ref 8.4–10.4)
CHLORIDE: 105 meq/L (ref 98–109)
CO2: 28 meq/L (ref 22–29)
CREATININE: 1.5 mg/dL — AB (ref 0.7–1.3)
EGFR: 47 mL/min/{1.73_m2} — ABNORMAL LOW (ref >90)
GLUCOSE: 143 mg/dL — AB (ref 70–140)
Potassium: 4 mEq/L (ref 3.5–5.1)
SODIUM: 140 meq/L (ref 136–145)
Total Bilirubin: 1.04 mg/dL (ref 0.20–1.20)
Total Protein: 6.8 g/dL (ref 6.4–8.3)

## 2015-06-07 LAB — CBC WITH DIFFERENTIAL/PLATELET
BASO%: 0.4 % (ref 0.0–2.0)
Basophils Absolute: 0.8 10*3/uL — ABNORMAL HIGH (ref 0.0–0.1)
EOS%: 0.1 % (ref 0.0–7.0)
Eosinophils Absolute: 0.2 10*3/uL (ref 0.0–0.5)
HEMATOCRIT: 39.1 % (ref 38.4–49.9)
HEMOGLOBIN: 12 g/dL — AB (ref 13.0–17.1)
LYMPH#: 180.8 10*3/uL — AB (ref 0.9–3.3)
LYMPH%: 94.8 % — AB (ref 14.0–49.0)
MCH: 26.8 pg — ABNORMAL LOW (ref 27.2–33.4)
MCHC: 30.7 g/dL — AB (ref 32.0–36.0)
MCV: 87.3 fL (ref 79.3–98.0)
MONO#: 3.4 10*3/uL — AB (ref 0.1–0.9)
MONO%: 1.8 % (ref 0.0–14.0)
NEUT%: 2.9 % — ABNORMAL LOW (ref 39.0–75.0)
NEUTROS ABS: 5.5 10*3/uL (ref 1.5–6.5)
Platelets: 164 10*3/uL (ref 140–400)
RBC: 4.48 10*6/uL (ref 4.20–5.82)
RDW: 16.2 % — ABNORMAL HIGH (ref 11.0–14.6)
WBC: 190.6 10*3/uL — AB (ref 4.0–10.3)
nRBC: 0 % (ref 0–0)

## 2015-06-07 LAB — LACTATE DEHYDROGENASE: LDH: 162 U/L (ref 125–245)

## 2015-06-08 LAB — IGG, IGA, IGM
IgA, Qn, Serum: 51 mg/dL — ABNORMAL LOW (ref 61–437)
IgG, Qn, Serum: 429 mg/dL — ABNORMAL LOW (ref 700–1600)
IgM, Qn, Serum: 10 mg/dL — ABNORMAL LOW (ref 15–143)

## 2015-06-15 DIAGNOSIS — E781 Pure hyperglyceridemia: Secondary | ICD-10-CM | POA: Diagnosis not present

## 2015-06-15 DIAGNOSIS — C9110 Chronic lymphocytic leukemia of B-cell type not having achieved remission: Secondary | ICD-10-CM | POA: Diagnosis not present

## 2015-06-15 DIAGNOSIS — Z7984 Long term (current) use of oral hypoglycemic drugs: Secondary | ICD-10-CM | POA: Diagnosis not present

## 2015-06-15 DIAGNOSIS — N189 Chronic kidney disease, unspecified: Secondary | ICD-10-CM | POA: Diagnosis not present

## 2015-06-15 DIAGNOSIS — I10 Essential (primary) hypertension: Secondary | ICD-10-CM | POA: Diagnosis not present

## 2015-06-15 DIAGNOSIS — J029 Acute pharyngitis, unspecified: Secondary | ICD-10-CM | POA: Diagnosis not present

## 2015-06-15 DIAGNOSIS — E782 Mixed hyperlipidemia: Secondary | ICD-10-CM | POA: Diagnosis not present

## 2015-06-15 DIAGNOSIS — I2510 Atherosclerotic heart disease of native coronary artery without angina pectoris: Secondary | ICD-10-CM | POA: Diagnosis not present

## 2015-06-15 DIAGNOSIS — Z86010 Personal history of colonic polyps: Secondary | ICD-10-CM | POA: Diagnosis not present

## 2015-06-15 DIAGNOSIS — E118 Type 2 diabetes mellitus with unspecified complications: Secondary | ICD-10-CM | POA: Diagnosis not present

## 2015-06-15 DIAGNOSIS — Z85820 Personal history of malignant melanoma of skin: Secondary | ICD-10-CM | POA: Diagnosis not present

## 2015-08-01 DIAGNOSIS — R972 Elevated prostate specific antigen [PSA]: Secondary | ICD-10-CM | POA: Diagnosis not present

## 2015-08-01 DIAGNOSIS — N4231 Prostatic intraepithelial neoplasia: Secondary | ICD-10-CM | POA: Diagnosis not present

## 2015-08-01 DIAGNOSIS — R3912 Poor urinary stream: Secondary | ICD-10-CM | POA: Diagnosis not present

## 2015-08-01 DIAGNOSIS — N401 Enlarged prostate with lower urinary tract symptoms: Secondary | ICD-10-CM | POA: Diagnosis not present

## 2015-09-06 ENCOUNTER — Ambulatory Visit (HOSPITAL_BASED_OUTPATIENT_CLINIC_OR_DEPARTMENT_OTHER): Payer: PPO | Admitting: Oncology

## 2015-09-06 ENCOUNTER — Other Ambulatory Visit (HOSPITAL_BASED_OUTPATIENT_CLINIC_OR_DEPARTMENT_OTHER): Payer: PPO

## 2015-09-06 ENCOUNTER — Telehealth: Payer: Self-pay | Admitting: Oncology

## 2015-09-06 VITALS — BP 118/70 | HR 62 | Temp 98.6°F | Resp 18 | Ht 65.0 in | Wt 163.4 lb

## 2015-09-06 DIAGNOSIS — C9110 Chronic lymphocytic leukemia of B-cell type not having achieved remission: Secondary | ICD-10-CM

## 2015-09-06 LAB — CBC WITH DIFFERENTIAL/PLATELET
BASO%: 0.4 % (ref 0.0–2.0)
BASOS ABS: 0.7 10*3/uL — AB (ref 0.0–0.1)
EOS%: 0.1 % (ref 0.0–7.0)
Eosinophils Absolute: 0.3 10*3/uL (ref 0.0–0.5)
HCT: 38.1 % — ABNORMAL LOW (ref 38.4–49.9)
HEMOGLOBIN: 11.6 g/dL — AB (ref 13.0–17.1)
LYMPH%: 95.4 % — ABNORMAL HIGH (ref 14.0–49.0)
MCH: 26.7 pg — AB (ref 27.2–33.4)
MCHC: 30.4 g/dL — ABNORMAL LOW (ref 32.0–36.0)
MCV: 87.6 fL (ref 79.3–98.0)
MONO#: 2.6 10*3/uL — ABNORMAL HIGH (ref 0.1–0.9)
MONO%: 1.4 % (ref 0.0–14.0)
NEUT#: 4.7 10*3/uL (ref 1.5–6.5)
NEUT%: 2.7 % — AB (ref 39.0–75.0)
PLATELETS: 144 10*3/uL (ref 140–400)
RBC: 4.35 10*6/uL (ref 4.20–5.82)
RDW: 16.5 % — ABNORMAL HIGH (ref 11.0–14.6)
WBC: 180.6 10*3/uL (ref 4.0–10.3)
lymph#: 172.3 10*3/uL — ABNORMAL HIGH (ref 0.9–3.3)
nRBC: 0 % (ref 0–0)

## 2015-09-06 LAB — COMPREHENSIVE METABOLIC PANEL
ALBUMIN: 4.1 g/dL (ref 3.5–5.0)
ALK PHOS: 78 U/L (ref 40–150)
ALT: 11 U/L (ref 0–55)
ANION GAP: 10 meq/L (ref 3–11)
AST: 15 U/L (ref 5–34)
BUN: 16.1 mg/dL (ref 7.0–26.0)
CO2: 26 mEq/L (ref 22–29)
Calcium: 9.9 mg/dL (ref 8.4–10.4)
Chloride: 108 mEq/L (ref 98–109)
Creatinine: 1.6 mg/dL — ABNORMAL HIGH (ref 0.7–1.3)
EGFR: 41 mL/min/{1.73_m2} — AB (ref >90)
GLUCOSE: 94 mg/dL (ref 70–140)
POTASSIUM: 4.7 meq/L (ref 3.5–5.1)
SODIUM: 144 meq/L (ref 136–145)
Total Bilirubin: 1.18 mg/dL (ref 0.20–1.20)
Total Protein: 6.9 g/dL (ref 6.4–8.3)

## 2015-09-06 LAB — LACTATE DEHYDROGENASE: LDH: 167 U/L (ref 125–245)

## 2015-09-06 LAB — TECHNOLOGIST REVIEW

## 2015-09-06 NOTE — Progress Notes (Signed)
ID: [name]   DOB: Feb 23, 1940  MR#: [id number]  [id number]  PCP: Gennette Pac, MD GYN: SU:  OTHER MD: Fransico Him, Phebe Colla, [name]  HISTORY OF PRESENT ILLNESS: From the earlier summary notes:  The patient had had multiple liver function tests and electrolytes, but no complete blood count since 1998 (that one apparently was normal) until he was referred to [facility] for evaluation of muscle aches, possibly related to his cholesterol-lowering medication.  As part of the workup, Dr. Rockwell Alexandria obtained a complete blood count on Jun 15, 2002, which showed a hemoglobin of 13.3, MCV 79.5, platelets 178,000 and a white cell count of 37.3, with 80% lymphocytes  The absolute lymphocyte count was 30,000.   His subsequent history is detailed below  INTERVAL HISTORY: [name] returns  todayfor followup of his long-standing chronic lymphoid leukemia. He has never accepted treatment and has been clinically very stable, with no anemia or thrombocytopenia problems no unusual infections. He is also followed at [facility] through Dr. Florene Glen and of course also at [facility] for his melanoma history.  One of the patient's brothers died 3 months ago from "liver cancer". According to [name] this Brother had 6 separate cancers although the "liver cancer" and have been metastases from an earlier gallbladder carcinoma.  REVIEW OF SYSTEMS: He goes to the gym every day. He is picking up a little bit of weight and his blood sugar is not "for I wanted". Aside from these issues a detailed review of systems today was noncontributory   PAST MEDICAL HISTORY: Past Medical History:  Diagnosis Date  . Anxiety   . 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 Coronary artery disease    s/p CABG 1998  . Diabetes mellitus without complication    Type II  . Dysplastic nevus 01/1996   melanoma 2013 followed by dermatologist Dr Denna Haggard  . Gout, unspecified 12/22/2010  .  Hypertension   . Lymphocytic leukemia    Chronic followed by Dr Florene Glen at [facility]  . MI (myocardial infarction) 1998  . Mixed dyslipidemia   . Wears partial dentures   Significant for hypertension, diabetes, hypercholesterolemia, coronary artery disease status post coronary artery bypass graft in 1998, history of septoplasty, history of sleep apnea, which apparently has resolved with aggressive treatment of his rhinitis, history of colonic polyps, history of peptic ulcer disease and history of remote and minimal tobacco abuse.   PAST SURGICAL HISTORY: Past Surgical History:  Procedure Laterality Date  . CARDIAC CATHETERIZATION    . CHOLECYSTECTOMY  2010  . COLONOSCOPY    . CORONARY ARTERY BYPASS GRAFT  1998   X7  . HERNIA REPAIR  2010   rting hernia  . TRIGGER FINGER RELEASE Left 04/30/2014   Procedure: RELEASE A-1 PULLEYS LEFT INDEX AND LEFT LONG FINGERS ;  Surgeon: Daryll Brod, MD;  Location: [place];  Service: Orthopedics;  Laterality: Left;    FAMILY HISTORY [name]'s father died at the age of 53 from "old age" according to [name].  His mother died at 33 with a cerebrovascular accident.  She had many children, of whom 12 made it to adulthood.  All of his brothers older than he (with 1 exception) have had prostate cancer.    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 [name] currently lives alone.. He has 2 children surviving.  One  son was born with hyaline membrane and died suddenly at the age of 38, about 5 years ago.  Son [name] teaches poly-sci in [place]. He has a daughter, the patient's only grandchild, . Son [name] lives in [place]. He has significant psychosocial issues not documented here. The Ropers are Mormons.    ADVANCED DIRECTIVES: in place  HEALTH MAINTENANCE: Social History  Substance Use Topics  . Smoking status: Never Smoker  . Smokeless  tobacco: Not on file  . Alcohol use No     Colonoscopy:  PSA: <4 per patient's report  Bone density:  Lipid panel:  No Known Allergies  Current Outpatient Prescriptions  Medication Sig Dispense Refill  . finasteride (PROSCAR) 5 MG tablet Take 5 mg by mouth daily.    Marland Kitchen aspirin 81 MG tablet Take 81 mg by mouth daily.    Marland Kitchen atorvastatin (LIPITOR) 40 MG tablet Take 40 mg by mouth daily.    . IRON, FERROUS GLUCONATE, PO Take by mouth. 365    . metFORMIN (GLUCOPHAGE) 500 MG tablet Take 500 mg by mouth 2 (two) times daily with a meal.    . metoprolol succinate (TOPROL-XL) 50 MG 24 hr tablet Take 50 mg by mouth daily. Take with or immediately following a meal.    . nateglinide (STARLIX) 60 MG tablet Take 60 mg by mouth 3 (three) times daily before meals.    . quinapril (ACCUPRIL) 10 MG tablet Take 10 mg by mouth at bedtime.     No current facility-administered medications for this visit.     OBJECTIVE: Middle-aged white male who appears stated age 75:   09/06/15 1129  BP: 118/70  Pulse: 62  Resp: 18  Temp: 98.6 F (37 C)     Body mass index is 27.19 kg/m.    ECOG FS: 0  Sclerae unicteric, EOMs intact Oropharynx clear and moist No cervical or supraclavicular adenopathy, no axillary or inguinal adenopathy Lungs no rales or rhonchi Heart regular rate and rhythm Abd soft, nontender, positive bowel sounds MSK no focal spinal tenderness, no upper extremity lymphedema Neuro: nonfocal, well oriented, appropriate affect Breasts: Deferred    LAB RESULTS: Results for [name], [name] (MRN [id number]) as of 09/06/2015 11:33  Ref. Range 09/03/2012 12:09 11/24/2012 13:27 08/04/2013 11:03 11/05/2013 11:19 02/11/2014 10:18 05/13/2014 11:13 12/07/2014 10:15 06/07/2015 10:21  lymph# Latest Ref Range: 0.9 - 3.3 10e3/uL 173.1 (H) 214.1 (H) 181.9 (H) 174.7 (H) 193.1 (H) 186.3 (H) 157.5 (H) 180.8 (H)   Lab Results  Component Value Date   WBC 180.6 (HH) 09/06/2015   NEUTROABS 4.7 09/06/2015   HGB  11.6 (L) 09/06/2015   HCT 38.1 (L) 09/06/2015   MCV 87.6 09/06/2015   PLT 144 09/06/2015      Chemistry      Component Value Date/Time   NA 140 06/07/2015 1021   K 4.0 06/07/2015 1021   CL 104 04/28/2014 1535   CL 109 (H) 07/08/2012 1321   CO2 28 06/07/2015 1021   BUN 20.1 06/07/2015 1021   CREATININE 1.5 (H) 06/07/2015 1021      Component Value Date/Time   CALCIUM 9.9 06/07/2015 1021   ALKPHOS 74 06/07/2015 1021   AST 14 06/07/2015 1021   ALT 11 06/07/2015 1021   BILITOT 1.04 06/07/2015 1021       No results found for: LABCA2  No components found for: LABCA125  No results for input(s): INR in the last 168 hours.  Urinalysis    Component Value Date/Time  LABSPEC 1.010 04/24/2007 1443   PHURINE 6.5 04/24/2007 1443   HGBUR Trace 04/24/2007 1443   BILIRUBINUR Negative 04/24/2007 1443   KETONESUR Negative 04/24/2007 1443   PROTEINUR Negative 04/24/2007 1443   NITRITE Negative 04/24/2007 1443   LEUKOCYTESUR Negative 04/24/2007 1443   STUDIES: No results found.   ASSESSMENT: 75 y.o. 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  He has notr accepted treatment so far.   PLAN:  [name] clinically stable now 13 years out from his initial diagnosis of chronic lymphoid leukemia untreated.  What we do for him is check his counts every 3 months at his request. He understands that I am concerned about his low immunoglobulin level, but so far he has not had any unusual or difficult to clear infections.  He is also followed at [facility] as well as by his primary care physician and urologist. Incidentally he is very concerned that his PSA has not been checked in more than one year. I suggested he discuss that further with Dr. Jana Hakim area  I see him on a once a year basis, but  he knows to call for any problems that may develop before the return visit here. Chauncey Cruel, [place] [phone] 09/06/2015

## 2015-09-06 NOTE — Telephone Encounter (Signed)
appt made per [facility] LOS

## 2015-09-19 DIAGNOSIS — H2511 Age-related nuclear cataract, right eye: Secondary | ICD-10-CM | POA: Diagnosis not present

## 2015-09-19 DIAGNOSIS — H43813 Vitreous degeneration, bilateral: Secondary | ICD-10-CM | POA: Diagnosis not present

## 2015-09-19 DIAGNOSIS — H2512 Age-related nuclear cataract, left eye: Secondary | ICD-10-CM | POA: Diagnosis not present

## 2015-09-19 DIAGNOSIS — E119 Type 2 diabetes mellitus without complications: Secondary | ICD-10-CM | POA: Diagnosis not present

## 2015-09-20 DIAGNOSIS — L738 Other specified follicular disorders: Secondary | ICD-10-CM | POA: Diagnosis not present

## 2015-09-20 DIAGNOSIS — Z85820 Personal history of malignant melanoma of skin: Secondary | ICD-10-CM | POA: Diagnosis not present

## 2015-09-20 DIAGNOSIS — L570 Actinic keratosis: Secondary | ICD-10-CM | POA: Diagnosis not present

## 2015-09-30 DIAGNOSIS — Z7982 Long term (current) use of aspirin: Secondary | ICD-10-CM | POA: Diagnosis not present

## 2015-09-30 DIAGNOSIS — I10 Essential (primary) hypertension: Secondary | ICD-10-CM | POA: Diagnosis not present

## 2015-09-30 DIAGNOSIS — Z85820 Personal history of malignant melanoma of skin: Secondary | ICD-10-CM | POA: Diagnosis not present

## 2015-09-30 DIAGNOSIS — C9110 Chronic lymphocytic leukemia of B-cell type not having achieved remission: Secondary | ICD-10-CM | POA: Diagnosis not present

## 2015-09-30 DIAGNOSIS — R944 Abnormal results of kidney function studies: Secondary | ICD-10-CM | POA: Diagnosis not present

## 2015-09-30 DIAGNOSIS — D720 Genetic anomalies of leukocytes: Secondary | ICD-10-CM | POA: Diagnosis not present

## 2015-09-30 DIAGNOSIS — E119 Type 2 diabetes mellitus without complications: Secondary | ICD-10-CM | POA: Diagnosis not present

## 2015-10-19 DIAGNOSIS — H43813 Vitreous degeneration, bilateral: Secondary | ICD-10-CM | POA: Diagnosis not present

## 2015-10-19 DIAGNOSIS — H2512 Age-related nuclear cataract, left eye: Secondary | ICD-10-CM | POA: Diagnosis not present

## 2015-10-19 DIAGNOSIS — E119 Type 2 diabetes mellitus without complications: Secondary | ICD-10-CM | POA: Diagnosis not present

## 2015-10-19 DIAGNOSIS — H2511 Age-related nuclear cataract, right eye: Secondary | ICD-10-CM | POA: Diagnosis not present

## 2015-11-11 DIAGNOSIS — E119 Type 2 diabetes mellitus without complications: Secondary | ICD-10-CM | POA: Diagnosis not present

## 2015-11-11 DIAGNOSIS — Z23 Encounter for immunization: Secondary | ICD-10-CM | POA: Diagnosis not present

## 2015-11-11 DIAGNOSIS — E782 Mixed hyperlipidemia: Secondary | ICD-10-CM | POA: Diagnosis not present

## 2015-11-11 DIAGNOSIS — Z79899 Other long term (current) drug therapy: Secondary | ICD-10-CM | POA: Diagnosis not present

## 2015-11-28 DIAGNOSIS — J029 Acute pharyngitis, unspecified: Secondary | ICD-10-CM | POA: Diagnosis not present

## 2015-11-28 DIAGNOSIS — K219 Gastro-esophageal reflux disease without esophagitis: Secondary | ICD-10-CM | POA: Diagnosis not present

## 2015-12-02 ENCOUNTER — Encounter: Payer: Self-pay | Admitting: Oncology

## 2015-12-06 ENCOUNTER — Other Ambulatory Visit (HOSPITAL_BASED_OUTPATIENT_CLINIC_OR_DEPARTMENT_OTHER): Payer: PPO

## 2015-12-06 ENCOUNTER — Other Ambulatory Visit: Payer: Self-pay | Admitting: *Deleted

## 2015-12-06 DIAGNOSIS — M109 Gout, unspecified: Secondary | ICD-10-CM

## 2015-12-06 DIAGNOSIS — C9110 Chronic lymphocytic leukemia of B-cell type not having achieved remission: Secondary | ICD-10-CM

## 2015-12-06 LAB — CBC WITH DIFFERENTIAL/PLATELET
BASO%: 0.4 % (ref 0.0–2.0)
Basophils Absolute: 0.6 10*3/uL — ABNORMAL HIGH (ref 0.0–0.1)
EOS%: 0.1 % (ref 0.0–7.0)
Eosinophils Absolute: 0.2 10*3/uL (ref 0.0–0.5)
HEMATOCRIT: 37.7 % — AB (ref 38.4–49.9)
HEMOGLOBIN: 11.6 g/dL — AB (ref 13.0–17.1)
LYMPH#: 146.8 10*3/uL — AB (ref 0.9–3.3)
LYMPH%: 94.7 % — ABNORMAL HIGH (ref 14.0–49.0)
MCH: 26.9 pg — AB (ref 27.2–33.4)
MCHC: 30.8 g/dL — ABNORMAL LOW (ref 32.0–36.0)
MCV: 87.5 fL (ref 79.3–98.0)
MONO#: 2.5 10*3/uL — AB (ref 0.1–0.9)
MONO%: 1.6 % (ref 0.0–14.0)
NEUT%: 3.2 % — AB (ref 39.0–75.0)
NEUTROS ABS: 4.9 10*3/uL (ref 1.5–6.5)
NRBC: 0 % (ref 0–0)
Platelets: 155 10*3/uL (ref 140–400)
RBC: 4.31 10*6/uL (ref 4.20–5.82)
RDW: 15.9 % — AB (ref 11.0–14.6)
WBC: 155 10*3/uL (ref 4.0–10.3)

## 2015-12-06 LAB — COMPREHENSIVE METABOLIC PANEL
ALBUMIN: 4.2 g/dL (ref 3.5–5.0)
ALK PHOS: 92 U/L (ref 40–150)
ALT: 9 U/L (ref 0–55)
AST: 15 U/L (ref 5–34)
Anion Gap: 12 mEq/L — ABNORMAL HIGH (ref 3–11)
BUN: 24.2 mg/dL (ref 7.0–26.0)
CO2: 24 mEq/L (ref 22–29)
Calcium: 10.1 mg/dL (ref 8.4–10.4)
Chloride: 105 mEq/L (ref 98–109)
Creatinine: 1.5 mg/dL — ABNORMAL HIGH (ref 0.7–1.3)
EGFR: 45 mL/min/{1.73_m2} — AB (ref >90)
GLUCOSE: 135 mg/dL (ref 70–140)
POTASSIUM: 4.8 meq/L (ref 3.5–5.1)
SODIUM: 142 meq/L (ref 136–145)
Total Bilirubin: 0.92 mg/dL (ref 0.20–1.20)
Total Protein: 7.2 g/dL (ref 6.4–8.3)

## 2015-12-06 LAB — URIC ACID: URIC ACID, SERUM: 5.5 mg/dL (ref 2.6–7.4)

## 2015-12-06 LAB — TECHNOLOGIST REVIEW

## 2015-12-06 LAB — LACTATE DEHYDROGENASE: LDH: 204 U/L (ref 125–245)

## 2015-12-07 LAB — IGG, IGA, IGM
IGM (IMMUNOGLOBIN M), SRM: 9 mg/dL — AB (ref 15–143)
IgA, Qn, Serum: 50 mg/dL — ABNORMAL LOW (ref 61–437)
IgG, Qn, Serum: 432 mg/dL — ABNORMAL LOW (ref 700–1600)

## 2016-02-03 DIAGNOSIS — Z9889 Other specified postprocedural states: Secondary | ICD-10-CM | POA: Diagnosis not present

## 2016-02-03 DIAGNOSIS — Z85828 Personal history of other malignant neoplasm of skin: Secondary | ICD-10-CM | POA: Diagnosis not present

## 2016-02-03 DIAGNOSIS — Z79899 Other long term (current) drug therapy: Secondary | ICD-10-CM | POA: Diagnosis not present

## 2016-02-03 DIAGNOSIS — R062 Wheezing: Secondary | ICD-10-CM | POA: Diagnosis not present

## 2016-02-03 DIAGNOSIS — E790 Hyperuricemia without signs of inflammatory arthritis and tophaceous disease: Secondary | ICD-10-CM | POA: Diagnosis not present

## 2016-02-03 DIAGNOSIS — Z7982 Long term (current) use of aspirin: Secondary | ICD-10-CM | POA: Diagnosis not present

## 2016-02-03 DIAGNOSIS — E785 Hyperlipidemia, unspecified: Secondary | ICD-10-CM | POA: Diagnosis not present

## 2016-02-03 DIAGNOSIS — R161 Splenomegaly, not elsewhere classified: Secondary | ICD-10-CM | POA: Diagnosis not present

## 2016-02-03 DIAGNOSIS — E119 Type 2 diabetes mellitus without complications: Secondary | ICD-10-CM | POA: Diagnosis not present

## 2016-02-03 DIAGNOSIS — C9110 Chronic lymphocytic leukemia of B-cell type not having achieved remission: Secondary | ICD-10-CM | POA: Diagnosis not present

## 2016-02-03 DIAGNOSIS — Z7984 Long term (current) use of oral hypoglycemic drugs: Secondary | ICD-10-CM | POA: Diagnosis not present

## 2016-02-03 DIAGNOSIS — D720 Genetic anomalies of leukocytes: Secondary | ICD-10-CM | POA: Diagnosis not present

## 2016-02-03 DIAGNOSIS — I10 Essential (primary) hypertension: Secondary | ICD-10-CM | POA: Diagnosis not present

## 2016-02-03 DIAGNOSIS — Z85820 Personal history of malignant melanoma of skin: Secondary | ICD-10-CM | POA: Diagnosis not present

## 2016-02-03 DIAGNOSIS — Z86008 Personal history of in-situ neoplasm of other site: Secondary | ICD-10-CM | POA: Diagnosis not present

## 2016-03-07 ENCOUNTER — Other Ambulatory Visit (HOSPITAL_BASED_OUTPATIENT_CLINIC_OR_DEPARTMENT_OTHER): Payer: PPO

## 2016-03-07 DIAGNOSIS — C9110 Chronic lymphocytic leukemia of B-cell type not having achieved remission: Secondary | ICD-10-CM | POA: Diagnosis not present

## 2016-03-07 LAB — COMPREHENSIVE METABOLIC PANEL
ALBUMIN: 4.5 g/dL (ref 3.5–5.0)
ALK PHOS: 94 U/L (ref 40–150)
ALT: 15 U/L (ref 0–55)
AST: 17 U/L (ref 5–34)
Anion Gap: 11 mEq/L (ref 3–11)
BUN: 19.2 mg/dL (ref 7.0–26.0)
CHLORIDE: 103 meq/L (ref 98–109)
CO2: 25 mEq/L (ref 22–29)
Calcium: 10.3 mg/dL (ref 8.4–10.4)
Creatinine: 1.5 mg/dL — ABNORMAL HIGH (ref 0.7–1.3)
EGFR: 44 mL/min/{1.73_m2} — AB (ref >90)
GLUCOSE: 169 mg/dL — AB (ref 70–140)
POTASSIUM: 5.1 meq/L (ref 3.5–5.1)
SODIUM: 139 meq/L (ref 136–145)
Total Bilirubin: 1.05 mg/dL (ref 0.20–1.20)
Total Protein: 7.3 g/dL (ref 6.4–8.3)

## 2016-03-07 LAB — TECHNOLOGIST REVIEW

## 2016-03-07 LAB — CBC WITH DIFFERENTIAL/PLATELET
BASO%: 0.3 % (ref 0.0–2.0)
Basophils Absolute: 0.6 10*3/uL — ABNORMAL HIGH (ref 0.0–0.1)
EOS%: 0.1 % (ref 0.0–7.0)
Eosinophils Absolute: 0.1 10*3/uL (ref 0.0–0.5)
HEMATOCRIT: 40 % (ref 38.4–49.9)
HGB: 12.6 g/dL — ABNORMAL LOW (ref 13.0–17.1)
LYMPH#: 158.3 10*3/uL — AB (ref 0.9–3.3)
LYMPH%: 95.5 % — ABNORMAL HIGH (ref 14.0–49.0)
MCH: 27 pg — ABNORMAL LOW (ref 27.2–33.4)
MCHC: 31.5 g/dL — ABNORMAL LOW (ref 32.0–36.0)
MCV: 85.8 fL (ref 79.3–98.0)
MONO#: 2.2 10*3/uL — AB (ref 0.1–0.9)
MONO%: 1.3 % (ref 0.0–14.0)
NEUT%: 2.8 % — AB (ref 39.0–75.0)
NEUTROS ABS: 4.6 10*3/uL (ref 1.5–6.5)
Platelets: 153 10*3/uL (ref 140–400)
RBC: 4.66 10*6/uL (ref 4.20–5.82)
RDW: 15.9 % — ABNORMAL HIGH (ref 11.0–14.6)
WBC: 165.8 10*3/uL (ref 4.0–10.3)
nRBC: 0 % (ref 0–0)

## 2016-03-07 LAB — LACTATE DEHYDROGENASE: LDH: 170 U/L (ref 125–245)

## 2016-03-08 LAB — IGG, IGA, IGM
IGA/IMMUNOGLOBULIN A, SERUM: 45 mg/dL — AB (ref 61–437)
IgG, Qn, Serum: 405 mg/dL — ABNORMAL LOW (ref 700–1600)
IgM, Qn, Serum: 13 mg/dL — ABNORMAL LOW (ref 15–143)

## 2016-06-01 DIAGNOSIS — E790 Hyperuricemia without signs of inflammatory arthritis and tophaceous disease: Secondary | ICD-10-CM | POA: Diagnosis not present

## 2016-06-01 DIAGNOSIS — D720 Genetic anomalies of leukocytes: Secondary | ICD-10-CM | POA: Diagnosis not present

## 2016-06-01 DIAGNOSIS — Z79899 Other long term (current) drug therapy: Secondary | ICD-10-CM | POA: Diagnosis not present

## 2016-06-01 DIAGNOSIS — Z7984 Long term (current) use of oral hypoglycemic drugs: Secondary | ICD-10-CM | POA: Diagnosis not present

## 2016-06-01 DIAGNOSIS — Z7982 Long term (current) use of aspirin: Secondary | ICD-10-CM | POA: Diagnosis not present

## 2016-06-01 DIAGNOSIS — I10 Essential (primary) hypertension: Secondary | ICD-10-CM | POA: Diagnosis not present

## 2016-06-01 DIAGNOSIS — E119 Type 2 diabetes mellitus without complications: Secondary | ICD-10-CM | POA: Diagnosis not present

## 2016-06-01 DIAGNOSIS — Z85828 Personal history of other malignant neoplasm of skin: Secondary | ICD-10-CM | POA: Diagnosis not present

## 2016-06-01 DIAGNOSIS — E785 Hyperlipidemia, unspecified: Secondary | ICD-10-CM | POA: Diagnosis not present

## 2016-06-01 DIAGNOSIS — C9190 Lymphoid leukemia, unspecified not having achieved remission: Secondary | ICD-10-CM | POA: Diagnosis not present

## 2016-06-01 DIAGNOSIS — R062 Wheezing: Secondary | ICD-10-CM | POA: Diagnosis not present

## 2016-06-01 DIAGNOSIS — C9110 Chronic lymphocytic leukemia of B-cell type not having achieved remission: Secondary | ICD-10-CM | POA: Diagnosis not present

## 2016-06-04 ENCOUNTER — Other Ambulatory Visit (HOSPITAL_BASED_OUTPATIENT_CLINIC_OR_DEPARTMENT_OTHER): Payer: PPO

## 2016-06-04 DIAGNOSIS — C9190 Lymphoid leukemia, unspecified not having achieved remission: Secondary | ICD-10-CM | POA: Diagnosis not present

## 2016-06-04 DIAGNOSIS — C9110 Chronic lymphocytic leukemia of B-cell type not having achieved remission: Secondary | ICD-10-CM

## 2016-06-04 LAB — COMPREHENSIVE METABOLIC PANEL
ALBUMIN: 4.1 g/dL (ref 3.5–5.0)
ALK PHOS: 79 U/L (ref 40–150)
ALT: 20 U/L (ref 0–55)
ANION GAP: 10 meq/L (ref 3–11)
AST: 18 U/L (ref 5–34)
BILIRUBIN TOTAL: 0.8 mg/dL (ref 0.20–1.20)
BUN: 22.6 mg/dL (ref 7.0–26.0)
CO2: 24 meq/L (ref 22–29)
CREATININE: 1.4 mg/dL — AB (ref 0.7–1.3)
Calcium: 9.7 mg/dL (ref 8.4–10.4)
Chloride: 108 mEq/L (ref 98–109)
EGFR: 50 mL/min/{1.73_m2} — ABNORMAL LOW (ref >90)
GLUCOSE: 92 mg/dL (ref 70–140)
Potassium: 4.3 mEq/L (ref 3.5–5.1)
SODIUM: 142 meq/L (ref 136–145)
TOTAL PROTEIN: 6.8 g/dL (ref 6.4–8.3)

## 2016-06-04 LAB — CBC WITH DIFFERENTIAL/PLATELET
BASO%: 0.1 % (ref 0.0–2.0)
Basophils Absolute: 0.2 10*3/uL — ABNORMAL HIGH (ref 0.0–0.1)
EOS%: 0.2 % (ref 0.0–7.0)
Eosinophils Absolute: 0.3 10*3/uL (ref 0.0–0.5)
HCT: 38.5 % (ref 38.4–49.9)
HEMOGLOBIN: 11.7 g/dL — AB (ref 13.0–17.1)
LYMPH%: 92.1 % — ABNORMAL HIGH (ref 14.0–49.0)
MCH: 25.7 pg — AB (ref 27.2–33.4)
MCHC: 30.4 g/dL — AB (ref 32.0–36.0)
MCV: 84.7 fL (ref 79.3–98.0)
MONO#: 3.6 10*3/uL — ABNORMAL HIGH (ref 0.1–0.9)
MONO%: 2.4 % (ref 0.0–14.0)
NEUT%: 5.2 % — ABNORMAL LOW (ref 39.0–75.0)
NEUTROS ABS: 7.8 10*3/uL — AB (ref 1.5–6.5)
Platelets: 156 10*3/uL (ref 140–400)
RBC: 4.54 10*6/uL (ref 4.20–5.82)
RDW: 16.6 % — AB (ref 11.0–14.6)
WBC: 150.5 10*3/uL — AB (ref 4.0–10.3)
lymph#: 138.6 10*3/uL — ABNORMAL HIGH (ref 0.9–3.3)

## 2016-06-04 LAB — TECHNOLOGIST REVIEW

## 2016-06-04 LAB — LACTATE DEHYDROGENASE: LDH: 220 U/L (ref 125–245)

## 2016-06-05 LAB — IGG, IGA, IGM
IGM (IMMUNOGLOBIN M), SRM: 9 mg/dL — AB (ref 15–143)
IgA, Qn, Serum: 33 mg/dL — ABNORMAL LOW (ref 61–437)
IgG, Qn, Serum: 397 mg/dL — ABNORMAL LOW (ref 700–1600)

## 2016-06-29 DIAGNOSIS — N189 Chronic kidney disease, unspecified: Secondary | ICD-10-CM | POA: Diagnosis not present

## 2016-06-29 DIAGNOSIS — C9110 Chronic lymphocytic leukemia of B-cell type not having achieved remission: Secondary | ICD-10-CM | POA: Diagnosis not present

## 2016-06-29 DIAGNOSIS — E782 Mixed hyperlipidemia: Secondary | ICD-10-CM | POA: Diagnosis not present

## 2016-06-29 DIAGNOSIS — I2510 Atherosclerotic heart disease of native coronary artery without angina pectoris: Secondary | ICD-10-CM | POA: Diagnosis not present

## 2016-06-29 DIAGNOSIS — E118 Type 2 diabetes mellitus with unspecified complications: Secondary | ICD-10-CM | POA: Diagnosis not present

## 2016-06-29 DIAGNOSIS — R946 Abnormal results of thyroid function studies: Secondary | ICD-10-CM | POA: Diagnosis not present

## 2016-06-29 DIAGNOSIS — Z85820 Personal history of malignant melanoma of skin: Secondary | ICD-10-CM | POA: Diagnosis not present

## 2016-06-29 DIAGNOSIS — Z125 Encounter for screening for malignant neoplasm of prostate: Secondary | ICD-10-CM | POA: Diagnosis not present

## 2016-06-29 DIAGNOSIS — Z86010 Personal history of colonic polyps: Secondary | ICD-10-CM | POA: Diagnosis not present

## 2016-06-29 DIAGNOSIS — I10 Essential (primary) hypertension: Secondary | ICD-10-CM | POA: Diagnosis not present

## 2016-06-29 DIAGNOSIS — Z7984 Long term (current) use of oral hypoglycemic drugs: Secondary | ICD-10-CM | POA: Diagnosis not present

## 2016-08-28 ENCOUNTER — Telehealth: Payer: Self-pay

## 2016-08-28 ENCOUNTER — Other Ambulatory Visit (HOSPITAL_BASED_OUTPATIENT_CLINIC_OR_DEPARTMENT_OTHER): Payer: PPO

## 2016-08-28 DIAGNOSIS — C9110 Chronic lymphocytic leukemia of B-cell type not having achieved remission: Secondary | ICD-10-CM

## 2016-08-28 DIAGNOSIS — C9190 Lymphoid leukemia, unspecified not having achieved remission: Secondary | ICD-10-CM | POA: Diagnosis not present

## 2016-08-28 LAB — CBC WITH DIFFERENTIAL/PLATELET
BASO%: 0.3 % (ref 0.0–2.0)
Basophils Absolute: 0.4 10*3/uL — ABNORMAL HIGH (ref 0.0–0.1)
EOS ABS: 0.1 10*3/uL (ref 0.0–0.5)
EOS%: 0.1 % (ref 0.0–7.0)
HCT: 39.8 % (ref 38.4–49.9)
HEMOGLOBIN: 12.3 g/dL — AB (ref 13.0–17.1)
LYMPH%: 94.8 % — ABNORMAL HIGH (ref 14.0–49.0)
MCH: 26.9 pg — AB (ref 27.2–33.4)
MCHC: 30.9 g/dL — AB (ref 32.0–36.0)
MCV: 86.9 fL (ref 79.3–98.0)
MONO#: 1.8 10*3/uL — AB (ref 0.1–0.9)
MONO%: 1.2 % (ref 0.0–14.0)
NEUT%: 3.6 % — ABNORMAL LOW (ref 39.0–75.0)
NEUTROS ABS: 5.2 10*3/uL (ref 1.5–6.5)
NRBC: 0 % (ref 0–0)
Platelets: 151 10*3/uL (ref 140–400)
RBC: 4.58 10*6/uL (ref 4.20–5.82)
RDW: 16.5 % — AB (ref 11.0–14.6)
WBC: 145.8 10*3/uL — AB (ref 4.0–10.3)
lymph#: 138.3 10*3/uL — ABNORMAL HIGH (ref 0.9–3.3)

## 2016-08-28 LAB — COMPREHENSIVE METABOLIC PANEL
ALT: 12 U/L (ref 0–55)
ANION GAP: 9 meq/L (ref 3–11)
AST: 17 U/L (ref 5–34)
Albumin: 4.3 g/dL (ref 3.5–5.0)
Alkaline Phosphatase: 77 U/L (ref 40–150)
BUN: 22.9 mg/dL (ref 7.0–26.0)
CALCIUM: 10 mg/dL (ref 8.4–10.4)
CHLORIDE: 108 meq/L (ref 98–109)
CO2: 25 mEq/L (ref 22–29)
Creatinine: 1.5 mg/dL — ABNORMAL HIGH (ref 0.7–1.3)
EGFR: 45 mL/min/{1.73_m2} — ABNORMAL LOW (ref >90)
Glucose: 115 mg/dl (ref 70–140)
POTASSIUM: 4.6 meq/L (ref 3.5–5.1)
Sodium: 142 mEq/L (ref 136–145)
Total Bilirubin: 1.2 mg/dL (ref 0.20–1.20)
Total Protein: 6.9 g/dL (ref 6.4–8.3)

## 2016-08-28 LAB — LACTATE DEHYDROGENASE: LDH: 185 U/L (ref 125–245)

## 2016-08-28 LAB — TECHNOLOGIST REVIEW

## 2016-08-28 NOTE — Telephone Encounter (Signed)
Critical lab value received.  Will inform Dr Jana Hakim

## 2016-08-29 LAB — IGG, IGA, IGM
IGA/IMMUNOGLOBULIN A, SERUM: 33 mg/dL — AB (ref 61–437)
IGM (IMMUNOGLOBIN M), SRM: 11 mg/dL — AB (ref 15–143)
IgG, Qn, Serum: 426 mg/dL — ABNORMAL LOW (ref 700–1600)

## 2016-09-04 ENCOUNTER — Ambulatory Visit (HOSPITAL_BASED_OUTPATIENT_CLINIC_OR_DEPARTMENT_OTHER): Payer: PPO | Admitting: Oncology

## 2016-09-04 VITALS — BP 126/62 | HR 63 | Temp 98.4°F | Resp 18 | Ht 65.0 in | Wt 157.6 lb

## 2016-09-04 DIAGNOSIS — N183 Chronic kidney disease, stage 3 unspecified: Secondary | ICD-10-CM

## 2016-09-04 DIAGNOSIS — C9110 Chronic lymphocytic leukemia of B-cell type not having achieved remission: Secondary | ICD-10-CM | POA: Diagnosis not present

## 2016-09-04 NOTE — Progress Notes (Signed)
ID: [name]   DOB: [birth year]  MR#: [id number]  IRC#:[id number]  PCP: Hulan Fess, MD GYN: SU:  OTHER MD: Fransico Him, Phebe Colla, [name]  HISTORY OF PRESENT ILLNESS: From the earlier summary notes:  The patient had had multiple liver function tests and electrolytes, but no complete blood count since 1998 (that one apparently was normal) until he was referred to [facility] for evaluation of muscle aches, possibly related to his cholesterol-lowering medication.  As part of the workup, Dr. Rockwell Alexandria obtained a complete blood count on Jun 15, 2002, which showed a hemoglobin of 13.3, MCV 79.5, platelets 178,000 and a white cell count of 37.3, with 80% lymphocytes  The absolute lymphocyte count was 30,000.   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 He remains clinically stable, with no fever, drenching sweats, pruritus, rash, unexplained fatigue, unexplained weight loss, or adenopathy.  REVIEW OF SYSTEMS: Gin continues to exercise by going to the gym twice daily. He recently spent time with his son in [place]. He has 1 grandchild that he enjoys being with. He is concerned that his TSH was a little bit up and wondered about starting treatment for that. He also wonders why his chronic kidney disease stage III has not been discussed further. Overall though he is doing "good". A detailed review of systems today was benign   PAST MEDICAL HISTORY: Past Medical History:  Diagnosis Date  . Anxiety   . 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 Coronary artery disease    s/p CABG 1998  . Diabetes mellitus without complication    Type II  . Dysplastic nevus 01/1996   melanoma 2013 followed by dermatologist Dr Denna Haggard  . Gout, unspecified 12/22/2010  . Hypertension   . Lymphocytic leukemia    Chronic followed by Dr Florene Glen at [facility]   . MI (myocardial infarction) 1998  . Mixed dyslipidemia   . Wears partial dentures   Significant for hypertension, diabetes, hypercholesterolemia, coronary artery disease status post coronary artery bypass graft in 1998, history of septoplasty, history of sleep apnea, which apparently has resolved with aggressive treatment of his rhinitis, history of colonic polyps, history of peptic ulcer disease and history of remote and minimal tobacco abuse.   PAST SURGICAL HISTORY: Past Surgical History:  Procedure Laterality Date  . CARDIAC CATHETERIZATION    . CHOLECYSTECTOMY  2010  . COLONOSCOPY    . CORONARY ARTERY BYPASS GRAFT  1998   X7  . HERNIA REPAIR  2010   rting hernia  . TRIGGER FINGER RELEASE Left 04/30/2014   Procedure: RELEASE A-1 PULLEYS LEFT INDEX AND LEFT LONG FINGERS ;  Surgeon: Daryll Brod, MD;  Location: [facility];  Service: Orthopedics;  Laterality: Left;    FAMILY HISTORY Mr. Crosley's father died at the age of 79 from "old age" according to Mr. Sigmund.  His mother died at 61 with a cerebrovascular accident.  She had many children, of whom 12 made it to adulthood.  All of his brothers older than he (with 1 exception) have had prostate cancer.    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 His wife of >40 years, [name], died Dec 16, 2012. [name]  lives alone.. He has 2 children surviving.  One son was born with hyaline membrane and died suddenly at the age of 28,  about 5 years ago.  Son [name] teaches poly-sci in [place]. He has a daughter, the patient's only grandchild, . Son [name] lives in [place]. He has significant psychosocial issues not documented here. The Ropers are Mormons.    ADVANCED DIRECTIVES: in place  HEALTH MAINTENANCE: Social History  Substance Use Topics  . Smoking status: Never Smoker  . Smokeless tobacco: Not on file  . Alcohol use No     Colonoscopy:  PSA: <4 per patient's  report  Bone density:  Lipid panel:  No Known Allergies  Current Outpatient Prescriptions  Medication Sig Dispense Refill  . aspirin 81 MG tablet Take 81 mg by mouth daily.    Marland Kitchen atorvastatin (LIPITOR) 40 MG tablet Take 40 mg by mouth daily.    . finasteride (PROSCAR) 5 MG tablet Take 5 mg by mouth daily.    . IRON, FERROUS GLUCONATE, PO Take by mouth. 365    . metFORMIN (GLUCOPHAGE) 500 MG tablet Take 500 mg by mouth 2 (two) times daily with a meal.    . metoprolol succinate (TOPROL-XL) 50 MG 24 hr tablet Take 50 mg by mouth daily. Take with or immediately following a meal.    . nateglinide (STARLIX) 60 MG tablet Take 60 mg by mouth 3 (three) times daily before meals.    . quinapril (ACCUPRIL) 10 MG tablet Take 10 mg by mouth at bedtime.     No current facility-administered medications for this visit.     OBJECTIVE: Middle-aged white male Who appears well Vitals:   09/04/16 1128  BP: 126/62  Pulse: 63  Resp: 18  Temp: 98.4 F (36.9 C)  SpO2: 100%     Body mass index is 26.23 kg/m.    ECOG FS: 0  Sclerae unicteric, pupils round and equal Oropharynx clear and moist No cervical or supraclavicular adenopathy, no cervical adenopathy Lungs no rales or rhonchi Heart regular rate and rhythm Abd soft, nontender, positive bowel sounds MSK no focal spinal tenderness, no upper extremity lymphedema Neuro: nonfocal, well oriented, appropriate affect   LAB RESULTS:  Lab Results  Component Value Date   WBC 145.8 (HH) 08/28/2016   NEUTROABS 5.2 08/28/2016   HGB 12.3 (L) 08/28/2016   HCT 39.8 08/28/2016   MCV 86.9 08/28/2016   PLT 151 08/28/2016      Chemistry      Component Value Date/Time   NA 142 08/28/2016 1123   K 4.6 08/28/2016 1123   CL 104 04/28/2014 1535   CL 109 (H) 07/08/2012 1321   CO2 25 08/28/2016 1123   BUN 22.9 08/28/2016 1123   CREATININE 1.5 (H) 08/28/2016 1123      Component Value Date/Time   CALCIUM 10.0 08/28/2016 1123   ALKPHOS 77 08/28/2016 1123    AST 17 08/28/2016 1123   ALT 12 08/28/2016 1123   BILITOT 1.20 08/28/2016 1123       No results found for: LABCA2  No components found for: IHKVQ259  No results for input(s): INR in the last 168 hours.  Urinalysis    Component Value Date/Time   LABSPEC 1.010 04/24/2007 1443   PHURINE 6.5 04/24/2007 1443   HGBUR Trace 04/24/2007 1443   BILIRUBINUR Negative 04/24/2007 1443   KETONESUR Negative 04/24/2007 1443   PROTEINUR Negative 04/24/2007 1443   NITRITE Negative 04/24/2007 1443   LEUKOCYTESUR Negative 04/24/2007 1443   STUDIES: No results found.   ASSESSMENT: 76 y.o. [place] man with a history of chronic lymphoid leukemia initially diagnosed in May 2004.  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  He has notr accepted treatment so far.   [name]:  [name] Is now 14 years out from initial diagnosis of chronic lymphoid leukemia. He has consistently refused treatment. He has not developed aggressive symptomatic seroma cytopenia or significant anemia. He has not had significant problems with adenopathy. He has no systemic symptoms.  We follow his IgG counts, which are of course low. He understands if he has a persistent infection he will need IVIG to help them overcome it.  We discussed his thyroid studies and the thresholds for intervention there.  We also discussed his chronic kidney disease status. I suggested he keep himself well-hydrated and avoid nonsteroidals. Of course he will also help for him to keep normal blood pressure and blood sugar level  Re: To follow his labs and a every 6 month schedule and he will see me again in one year. He knows to call for any problems that may develop before the next visit here.  Marland Kitchen Chauncey Cruel, [place] [phone] 09/04/2016

## 2016-10-05 DIAGNOSIS — E790 Hyperuricemia without signs of inflammatory arthritis and tophaceous disease: Secondary | ICD-10-CM | POA: Diagnosis not present

## 2016-10-05 DIAGNOSIS — Z85820 Personal history of malignant melanoma of skin: Secondary | ICD-10-CM | POA: Diagnosis not present

## 2016-10-05 DIAGNOSIS — Z85828 Personal history of other malignant neoplasm of skin: Secondary | ICD-10-CM | POA: Diagnosis not present

## 2016-10-05 DIAGNOSIS — C9190 Lymphoid leukemia, unspecified not having achieved remission: Secondary | ICD-10-CM | POA: Diagnosis not present

## 2016-12-27 DIAGNOSIS — Z23 Encounter for immunization: Secondary | ICD-10-CM | POA: Diagnosis not present

## 2017-01-03 DIAGNOSIS — E1165 Type 2 diabetes mellitus with hyperglycemia: Secondary | ICD-10-CM | POA: Diagnosis not present

## 2017-01-03 DIAGNOSIS — I2510 Atherosclerotic heart disease of native coronary artery without angina pectoris: Secondary | ICD-10-CM | POA: Diagnosis not present

## 2017-01-03 DIAGNOSIS — N189 Chronic kidney disease, unspecified: Secondary | ICD-10-CM | POA: Diagnosis not present

## 2017-01-03 DIAGNOSIS — Z86010 Personal history of colonic polyps: Secondary | ICD-10-CM | POA: Diagnosis not present

## 2017-01-03 DIAGNOSIS — Z85820 Personal history of malignant melanoma of skin: Secondary | ICD-10-CM | POA: Diagnosis not present

## 2017-01-03 DIAGNOSIS — Z7984 Long term (current) use of oral hypoglycemic drugs: Secondary | ICD-10-CM | POA: Diagnosis not present

## 2017-01-03 DIAGNOSIS — E782 Mixed hyperlipidemia: Secondary | ICD-10-CM | POA: Diagnosis not present

## 2017-01-03 DIAGNOSIS — C9110 Chronic lymphocytic leukemia of B-cell type not having achieved remission: Secondary | ICD-10-CM | POA: Diagnosis not present

## 2017-01-03 DIAGNOSIS — I10 Essential (primary) hypertension: Secondary | ICD-10-CM | POA: Diagnosis not present

## 2017-01-03 DIAGNOSIS — Z125 Encounter for screening for malignant neoplasm of prostate: Secondary | ICD-10-CM | POA: Diagnosis not present

## 2017-01-03 DIAGNOSIS — R946 Abnormal results of thyroid function studies: Secondary | ICD-10-CM | POA: Diagnosis not present

## 2017-03-04 DIAGNOSIS — M25542 Pain in joints of left hand: Secondary | ICD-10-CM | POA: Diagnosis not present

## 2017-03-04 DIAGNOSIS — E1122 Type 2 diabetes mellitus with diabetic chronic kidney disease: Secondary | ICD-10-CM | POA: Diagnosis not present

## 2017-03-04 DIAGNOSIS — E039 Hypothyroidism, unspecified: Secondary | ICD-10-CM | POA: Diagnosis not present

## 2017-03-04 DIAGNOSIS — E782 Mixed hyperlipidemia: Secondary | ICD-10-CM | POA: Diagnosis not present

## 2017-03-05 ENCOUNTER — Inpatient Hospital Stay: Payer: PPO | Attending: Oncology

## 2017-03-05 DIAGNOSIS — C9110 Chronic lymphocytic leukemia of B-cell type not having achieved remission: Secondary | ICD-10-CM

## 2017-03-05 LAB — COMPREHENSIVE METABOLIC PANEL
ALK PHOS: 83 U/L (ref 40–150)
ALT: 7 U/L (ref 0–55)
ANION GAP: 11 (ref 3–11)
AST: 15 U/L (ref 5–34)
Albumin: 4.4 g/dL (ref 3.5–5.0)
BUN: 26 mg/dL (ref 7–26)
CALCIUM: 10 mg/dL (ref 8.4–10.4)
CO2: 28 mmol/L (ref 22–29)
Chloride: 102 mmol/L (ref 98–109)
Creatinine, Ser: 1.51 mg/dL — ABNORMAL HIGH (ref 0.70–1.30)
GFR, EST AFRICAN AMERICAN: 50 mL/min — AB (ref >60)
GFR, EST NON AFRICAN AMERICAN: 43 mL/min — AB (ref >60)
Glucose, Bld: 74 mg/dL (ref 70–140)
POTASSIUM: 4.5 mmol/L (ref 3.5–5.1)
Sodium: 141 mmol/L (ref 136–145)
TOTAL PROTEIN: 7.2 g/dL (ref 6.4–8.3)
Total Bilirubin: 1.2 mg/dL (ref 0.2–1.2)

## 2017-03-05 LAB — CBC WITH DIFFERENTIAL/PLATELET
BAND NEUTROPHILS: 0 %
BLASTS: 0 %
Basophils Absolute: 0 10*3/uL (ref 0.0–0.1)
Basophils Relative: 0 %
EOS ABS: 0 10*3/uL (ref 0.0–0.5)
Eosinophils Relative: 0 %
HCT: 42.3 % (ref 38.4–49.9)
Hemoglobin: 13.1 g/dL (ref 13.0–17.1)
LYMPHS PCT: 97 %
Lymphs Abs: 153.2 10*3/uL — ABNORMAL HIGH (ref 0.9–3.3)
MCH: 27.4 pg (ref 27.2–33.4)
MCHC: 31 g/dL — AB (ref 32.0–36.0)
MCV: 88.5 fL (ref 79.3–98.0)
MONOS PCT: 1 %
Metamyelocytes Relative: 0 %
Monocytes Absolute: 1.6 10*3/uL — ABNORMAL HIGH (ref 0.1–0.9)
Myelocytes: 0 %
NEUTROS ABS: 3.2 10*3/uL (ref 1.5–6.5)
NEUTROS PCT: 2 %
NRBC: 0 /100{WBCs}
OTHER: 0 %
Platelets: 151 10*3/uL (ref 140–400)
Promyelocytes Absolute: 0 %
RBC: 4.78 MIL/uL (ref 4.20–5.82)
RDW: 15.8 % — AB (ref 11.0–14.6)
WBC: 158 10*3/uL (ref 4.0–10.3)

## 2017-03-05 LAB — LACTATE DEHYDROGENASE: LDH: 165 U/L (ref 125–245)

## 2017-03-06 LAB — IGG, IGA, IGM
IGA: 36 mg/dL — AB (ref 61–437)
IGG (IMMUNOGLOBIN G), SERUM: 452 mg/dL — AB (ref 700–1600)
IGM (IMMUNOGLOBULIN M), SRM: 10 mg/dL — AB (ref 15–143)

## 2017-05-08 ENCOUNTER — Ambulatory Visit: Payer: Self-pay | Admitting: Podiatry

## 2017-06-26 DIAGNOSIS — Z125 Encounter for screening for malignant neoplasm of prostate: Secondary | ICD-10-CM | POA: Diagnosis not present

## 2017-06-26 DIAGNOSIS — I2510 Atherosclerotic heart disease of native coronary artery without angina pectoris: Secondary | ICD-10-CM | POA: Diagnosis not present

## 2017-06-26 DIAGNOSIS — E1122 Type 2 diabetes mellitus with diabetic chronic kidney disease: Secondary | ICD-10-CM | POA: Diagnosis not present

## 2017-06-26 DIAGNOSIS — Z8042 Family history of malignant neoplasm of prostate: Secondary | ICD-10-CM | POA: Diagnosis not present

## 2017-06-26 DIAGNOSIS — E039 Hypothyroidism, unspecified: Secondary | ICD-10-CM | POA: Diagnosis not present

## 2017-06-26 DIAGNOSIS — I10 Essential (primary) hypertension: Secondary | ICD-10-CM | POA: Diagnosis not present

## 2017-06-26 DIAGNOSIS — C9110 Chronic lymphocytic leukemia of B-cell type not having achieved remission: Secondary | ICD-10-CM | POA: Diagnosis not present

## 2017-06-26 DIAGNOSIS — Z85820 Personal history of malignant melanoma of skin: Secondary | ICD-10-CM | POA: Diagnosis not present

## 2017-06-26 DIAGNOSIS — E782 Mixed hyperlipidemia: Secondary | ICD-10-CM | POA: Diagnosis not present

## 2017-06-26 DIAGNOSIS — N183 Chronic kidney disease, stage 3 (moderate): Secondary | ICD-10-CM | POA: Diagnosis not present

## 2017-07-31 ENCOUNTER — Other Ambulatory Visit: Payer: Self-pay | Admitting: Physician Assistant

## 2017-07-31 DIAGNOSIS — D044 Carcinoma in situ of skin of scalp and neck: Secondary | ICD-10-CM | POA: Diagnosis not present

## 2017-07-31 DIAGNOSIS — L814 Other melanin hyperpigmentation: Secondary | ICD-10-CM | POA: Diagnosis not present

## 2017-07-31 DIAGNOSIS — C44619 Basal cell carcinoma of skin of left upper limb, including shoulder: Secondary | ICD-10-CM | POA: Diagnosis not present

## 2017-07-31 DIAGNOSIS — C44519 Basal cell carcinoma of skin of other part of trunk: Secondary | ICD-10-CM | POA: Diagnosis not present

## 2017-07-31 DIAGNOSIS — L570 Actinic keratosis: Secondary | ICD-10-CM | POA: Diagnosis not present

## 2017-07-31 DIAGNOSIS — D485 Neoplasm of uncertain behavior of skin: Secondary | ICD-10-CM | POA: Diagnosis not present

## 2017-08-27 ENCOUNTER — Inpatient Hospital Stay: Payer: PPO | Attending: Oncology

## 2017-08-27 DIAGNOSIS — Z85820 Personal history of malignant melanoma of skin: Secondary | ICD-10-CM | POA: Insufficient documentation

## 2017-08-27 DIAGNOSIS — C9110 Chronic lymphocytic leukemia of B-cell type not having achieved remission: Secondary | ICD-10-CM

## 2017-08-27 DIAGNOSIS — I10 Essential (primary) hypertension: Secondary | ICD-10-CM | POA: Insufficient documentation

## 2017-08-27 DIAGNOSIS — I2510 Atherosclerotic heart disease of native coronary artery without angina pectoris: Secondary | ICD-10-CM | POA: Insufficient documentation

## 2017-08-27 DIAGNOSIS — E7800 Pure hypercholesterolemia, unspecified: Secondary | ICD-10-CM | POA: Insufficient documentation

## 2017-08-27 DIAGNOSIS — I252 Old myocardial infarction: Secondary | ICD-10-CM | POA: Diagnosis not present

## 2017-08-27 DIAGNOSIS — Z7984 Long term (current) use of oral hypoglycemic drugs: Secondary | ICD-10-CM | POA: Diagnosis not present

## 2017-08-27 DIAGNOSIS — Z87891 Personal history of nicotine dependence: Secondary | ICD-10-CM | POA: Insufficient documentation

## 2017-08-27 DIAGNOSIS — F419 Anxiety disorder, unspecified: Secondary | ICD-10-CM | POA: Insufficient documentation

## 2017-08-27 DIAGNOSIS — E119 Type 2 diabetes mellitus without complications: Secondary | ICD-10-CM | POA: Diagnosis not present

## 2017-08-27 DIAGNOSIS — E782 Mixed hyperlipidemia: Secondary | ICD-10-CM | POA: Insufficient documentation

## 2017-08-27 DIAGNOSIS — Z7982 Long term (current) use of aspirin: Secondary | ICD-10-CM | POA: Insufficient documentation

## 2017-08-27 DIAGNOSIS — Z86010 Personal history of colonic polyps: Secondary | ICD-10-CM | POA: Diagnosis not present

## 2017-08-27 DIAGNOSIS — M109 Gout, unspecified: Secondary | ICD-10-CM | POA: Diagnosis not present

## 2017-08-27 DIAGNOSIS — Z79899 Other long term (current) drug therapy: Secondary | ICD-10-CM | POA: Diagnosis not present

## 2017-08-27 DIAGNOSIS — Z8042 Family history of malignant neoplasm of prostate: Secondary | ICD-10-CM | POA: Diagnosis not present

## 2017-08-27 DIAGNOSIS — Z8711 Personal history of peptic ulcer disease: Secondary | ICD-10-CM | POA: Insufficient documentation

## 2017-08-27 DIAGNOSIS — C9190 Lymphoid leukemia, unspecified not having achieved remission: Secondary | ICD-10-CM | POA: Diagnosis not present

## 2017-08-27 LAB — COMPREHENSIVE METABOLIC PANEL
ALBUMIN: 4.1 g/dL (ref 3.5–5.0)
ALK PHOS: 76 U/L (ref 38–126)
ALT: 13 U/L (ref 0–44)
ANION GAP: 9 (ref 5–15)
AST: 15 U/L (ref 15–41)
BILIRUBIN TOTAL: 1.2 mg/dL (ref 0.3–1.2)
BUN: 25 mg/dL — ABNORMAL HIGH (ref 8–23)
CALCIUM: 9.1 mg/dL (ref 8.9–10.3)
CO2: 27 mmol/L (ref 22–32)
Chloride: 105 mmol/L (ref 98–111)
Creatinine, Ser: 1.45 mg/dL — ABNORMAL HIGH (ref 0.61–1.24)
GFR, EST AFRICAN AMERICAN: 52 mL/min — AB (ref >60)
GFR, EST NON AFRICAN AMERICAN: 45 mL/min — AB (ref >60)
Glucose, Bld: 114 mg/dL — ABNORMAL HIGH (ref 70–99)
POTASSIUM: 4.1 mmol/L (ref 3.5–5.1)
Sodium: 141 mmol/L (ref 135–145)
TOTAL PROTEIN: 6.4 g/dL — AB (ref 6.5–8.1)

## 2017-08-27 LAB — CBC WITH DIFFERENTIAL/PLATELET
Basophils Absolute: 0.3 10*3/uL — ABNORMAL HIGH (ref 0.0–0.1)
Basophils Relative: 0 %
EOS ABS: 0.1 10*3/uL (ref 0.0–0.5)
Eosinophils Relative: 0 %
HEMATOCRIT: 36.8 % — AB (ref 38.4–49.9)
Hemoglobin: 11.5 g/dL — ABNORMAL LOW (ref 13.0–17.1)
LYMPHS ABS: 111.1 10*3/uL — AB (ref 0.9–3.3)
LYMPHS PCT: 95 %
MCH: 27.7 pg (ref 27.2–33.4)
MCHC: 31.3 g/dL — ABNORMAL LOW (ref 32.0–36.0)
MCV: 88.7 fL (ref 79.3–98.0)
Monocytes Absolute: 1.6 10*3/uL — ABNORMAL HIGH (ref 0.1–0.9)
Monocytes Relative: 1 %
NEUTROS PCT: 4 %
Neutro Abs: 4 10*3/uL (ref 1.5–6.5)
PLATELETS: 124 10*3/uL — AB (ref 140–400)
RBC: 4.15 MIL/uL — AB (ref 4.20–5.82)
RDW: 15.8 % — ABNORMAL HIGH (ref 11.0–14.6)
WBC: 117.2 10*3/uL — AB (ref 4.0–10.3)

## 2017-08-27 LAB — LACTATE DEHYDROGENASE: LDH: 155 U/L (ref 98–192)

## 2017-08-28 LAB — IGG, IGA, IGM
IGG (IMMUNOGLOBIN G), SERUM: 415 mg/dL — AB (ref 700–1600)
IgA: 32 mg/dL — ABNORMAL LOW (ref 61–437)
IgM (Immunoglobulin M), Srm: 9 mg/dL — ABNORMAL LOW (ref 15–143)

## 2017-08-29 DIAGNOSIS — E039 Hypothyroidism, unspecified: Secondary | ICD-10-CM | POA: Diagnosis not present

## 2017-08-29 DIAGNOSIS — E119 Type 2 diabetes mellitus without complications: Secondary | ICD-10-CM | POA: Diagnosis not present

## 2017-08-29 DIAGNOSIS — N183 Chronic kidney disease, stage 3 (moderate): Secondary | ICD-10-CM | POA: Diagnosis not present

## 2017-08-29 DIAGNOSIS — C9110 Chronic lymphocytic leukemia of B-cell type not having achieved remission: Secondary | ICD-10-CM | POA: Diagnosis not present

## 2017-08-29 DIAGNOSIS — C439 Malignant melanoma of skin, unspecified: Secondary | ICD-10-CM | POA: Diagnosis not present

## 2017-08-29 DIAGNOSIS — K921 Melena: Secondary | ICD-10-CM | POA: Diagnosis not present

## 2017-08-29 DIAGNOSIS — I2510 Atherosclerotic heart disease of native coronary artery without angina pectoris: Secondary | ICD-10-CM | POA: Diagnosis not present

## 2017-09-02 NOTE — Progress Notes (Signed)
ID: [name]   DOB: 22-Mar-1940  MR#: [id number]  LNL#:[id number]  PCP: Hulan Fess, MD GYN: SU:  OTHER MD: Fransico Him, Phebe Colla, [name]  HISTORY OF PRESENT ILLNESS: From the earlier summary notes:  The patient had had multiple liver function tests and electrolytes, but no complete blood count since 1998 (that one apparently was normal) until he was referred to [facility] for evaluation of muscle aches, possibly related to his cholesterol-lowering medication.  As part of the workup, Dr. Rockwell Alexandria obtained a complete blood count on Jun 15, 2002, which showed a hemoglobin of 13.3, MCV 79.5, platelets 178,000 and a white cell count of 37.3, with 80% lymphocytes  The absolute lymphocyte count was 30,000.   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  Nevertheless his absolute lymphocyte count has been trending down in the last few determinations  Results for HYLAND, MOLLENKOPF (MRN [id number]) as of 09/03/2017 12:37  Ref. Range 09/06/2015 11:21 12/06/2015 10:16 03/07/2016 11:24 06/04/2016 11:09 08/28/2016 11:23  lymph# Latest Ref Range: 0.9 - 3.3 10e3/uL 172.3 (H) 146.8 (H) 158.3 (H) 138.6 (H) 138.3 (H)    REVIEW OF SYSTEMS: [name] denies drenching sweats or fevers.  He is very active and walks 3 miles every day.  He is managing his diet because he feels he is 10 pounds overweight despite which he manages to keep his hemoglobin A1c between 6.4 and 6.3, he says.  He has some stomach upset and has stopped taking his ferrous gluconate.  In fact he is scheduled for EGD in the next week or so.  A detailed review of systems today was otherwise noncontributory   PAST MEDICAL HISTORY: Past Medical History:  Diagnosis Date  . Anxiety   . 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 Coronary artery disease    s/p CABG 1998  . Diabetes mellitus  without complication    Type II  . Dysplastic nevus 01/1996   melanoma 2013 followed by dermatologist Dr Denna Haggard  . Gout, unspecified 12/22/2010  . Hypertension   . Lymphocytic leukemia    Chronic [facility] Fl[facility] Glen [facility]  . MI (myocardial infarction) 1998  . Mixed dyslipidemia   . Wears partial dentures   Significant for hypertension, diabetes, hypercholesterolemia, coronary artery disease status post coronary artery bypass graft in 1998, history of septoplasty, history of sleep apnea, which apparently has resolved with aggressive treatment of his rhinitis, history of colonic polyps, history of peptic ulcer disease and history of remote and minimal tobacco abuse.   PAST SURGICAL HISTORY: Past Surgical History:  Procedure Laterality Date  . CARDIAC CATHETERIZATION    . CHOLECYSTECTOMY  2010  . COLONOSCOPY    . CORONARY ARTERY BYPASS GRAFT  1998   X7  . HERNIA REPAIR  2010   rting hernia  . TRIGGER FINGER RELEASE Left 04/30/2014   Procedure: RELEASE A-1 PULLEYS LEFT INDEX AND LEFT LONG FINGERS ;  Surgeon: Daryll Brod, MD;  Location: [facility];  Service: Orthopedics;  Laterality: Left;    FAMILY HISTORY Mr. Rieves's father died at the age of 59 from "old age" according to Mr. Sindoni.  His mother died at 4 with a cerebrovascular accident.  She had many children, of whom 12 made it to adulthood.  All of his brothers older than he (with 1 exception) have had prostate cancer.  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 His wife of >40 years, [name], died Dec 05, 2012. [name]  lives alone.. He has 2 children surviving.  One son was born with hyaline membrane and died suddenly at the age of 65, about 5 years ago.  Son [name] teaches poly-sci in [place]. He has a daughter, the patient's only grandchild, . Son [name] lives in [place]. He has significant psychosocial issues not documented here. The Ropers are Mormons.     ADVANCED DIRECTIVES: in place  HEALTH MAINTENANCE: Social History   Tobacco Use  . Smoking status: Never Smoker  Substance Use Topics  . Alcohol use: No  . Drug use: No     Colonoscopy:  PSA: <4 per patient's report  Bone density:  Lipid panel:  No Known Allergies  Current Outpatient Medications  Medication Sig Dispense Refill  . aspirin 81 MG tablet Take 81 mg by mouth daily.    Marland Kitchen atorvastatin (LIPITOR) 40 MG tablet Take 40 mg by mouth daily.    . finasteride (PROSCAR) 5 MG tablet Take 5 mg by mouth daily.    . IRON, FERROUS GLUCONATE, PO Take by mouth. 365    . metFORMIN (GLUCOPHAGE) 500 MG tablet Take 500 mg by mouth 2 (two) times daily with a meal.    . metoprolol succinate (TOPROL-XL) 50 MG 24 hr tablet Take 50 mg by mouth daily. Take with or immediately following a meal.    . nateglinide (STARLIX) 60 MG tablet Take 60 mg by mouth 3 (three) times daily before meals.    . quinapril (ACCUPRIL) 10 MG tablet Take 10 mg by mouth at bedtime.     No current facility-administered medications for this visit.     OBJECTIVE: Middle-aged white man in no acute distress Vitals:   09/03/17 1216  BP: 129/65  Pulse: 66  Resp: 18  Temp: 98.1 F (36.7 C)  SpO2: 99%     Body mass index is 26.51 kg/m.    ECOG FS: 0  Sclerae unicteric, EOMs intact Oropharynx clear and moist Minimal left cervical adenopathy.  Moderate right axillary adenopathy.  No inguinal adenopathy Lungs no rales or rhonchi Heart regular rate and rhythm Abd soft, nontender, positive bowel sounds MSK no focal spinal tenderness, no upper extremity lymphedema Neuro: nonfocal, well oriented, appropriate affect   LAB RESULTS:  Lab Results  Component Value Date   WBC 117.2 (HH) 08/27/2017   NEUTROABS 4.0 08/27/2017   HGB 11.5 (L) 08/27/2017   HCT 36.8 (L) 08/27/2017   MCV 88.7 08/27/2017   PLT 124 (L) 08/27/2017      Chemistry      Component Value Date/Time   NA 141 08/27/2017 1120   NA 142  08/28/2016 1123   K 4.1 08/27/2017 1120   K 4.6 08/28/2016 1123   CL 105 08/27/2017 1120   CL 109 (H) 07/08/2012 1321   CO2 27 08/27/2017 1120   CO2 25 08/28/2016 1123   BUN 25 (H) 08/27/2017 1120   BUN 22.9 08/28/2016 1123   CREATININE 1.45 (H) 08/27/2017 1120   CREATININE 1.5 (H) 08/28/2016 1123      Component Value Date/Time   CALCIUM 9.1 08/27/2017 1120   CALCIUM 10.0 08/28/2016 1123   ALKPHOS 76 08/27/2017 1120   ALKPHOS 77 08/28/2016 1123   AST 15 08/27/2017 1120   AST 17 08/28/2016 1123   ALT 13 08/27/2017 1120   ALT 12 08/28/2016 1123  BILITOT 1.2 08/27/2017 1120   BILITOT 1.20 08/28/2016 1123       No results found for: LABCA2  No components found for: [id number]  No results for input(s): INR in the last 168 hours.  Urinalysis    Component Value Date/Time   LABSPEC 1.010 04/24/2007 1443   PHURINE 6.5 04/24/2007 1443   HGBUR Trace 04/24/2007 1443   BILIRUBINUR Negative 04/24/2007 1443   KETONESUR Negative 04/24/2007 1443   PROTEINUR Negative 04/24/2007 1443   NITRITE Negative 04/24/2007 1443   LEUKOCYTESUR Negative 04/24/2007 1443   STUDIES: No results found.   ASSESSMENT: 77 y.o. 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  He has not accepted treatment so far.   PLAN:  [name] is now 15 years out from initial diagnosis of his chronic lymphoid leukemia.  He is an experiment of 1, namely observation only as treatment.  Indications for treatment of course include anemia and thrombocytopenia.  He does have both.  Nevertheless he is not interested in starting therapy.  We did discuss Imbruvica which is probably why I would go if he did decide to get treated at some point  His total IgG level is a little over 400 which is not great but is adequate.  There have not been any  intercurrent infections.  I am hopeful of the problems he is having with his stomach are not going to be related to his lymphoma.  If he does have a malignant ulceration that might push further towards treatment.  His renal function is not perfect.  This is going to be due to his hypertension and diabetes and I encouraged him to control those as much as possible  I do not have a simple explanation as to why his absolute lymphocyte count has been trending down.  It could be that his bone marrow is becoming "exhausted".  If so we can expect further problems from cytopenias  Otherwise we are continuing to observe with lab work every 3 months and visit every year.  He knows I will be glad to see him at any other time in the future if and when the need arises  Vilma Will, Virgie Dad, MD  09/03/17 1:54 PM Medical Oncology and Hematology [facility] [place], [place] [address] Tel. [phone]    Fax. [phone]  Alice Rieger, am acting as scribe for Chauncey Cruel MD.  I, Lurline Del MD, have reviewed the above documentation for accuracy and completeness, and I agree with the above.

## 2017-09-03 ENCOUNTER — Inpatient Hospital Stay (HOSPITAL_BASED_OUTPATIENT_CLINIC_OR_DEPARTMENT_OTHER): Payer: PPO | Admitting: Oncology

## 2017-09-03 ENCOUNTER — Telehealth: Payer: Self-pay | Admitting: Oncology

## 2017-09-03 VITALS — BP 129/65 | HR 66 | Temp 98.1°F | Resp 18 | Ht 65.0 in | Wt 159.3 lb

## 2017-09-03 DIAGNOSIS — I10 Essential (primary) hypertension: Secondary | ICD-10-CM

## 2017-09-03 DIAGNOSIS — E782 Mixed hyperlipidemia: Secondary | ICD-10-CM | POA: Diagnosis not present

## 2017-09-03 DIAGNOSIS — Z7984 Long term (current) use of oral hypoglycemic drugs: Secondary | ICD-10-CM

## 2017-09-03 DIAGNOSIS — Z8042 Family history of malignant neoplasm of prostate: Secondary | ICD-10-CM

## 2017-09-03 DIAGNOSIS — I2510 Atherosclerotic heart disease of native coronary artery without angina pectoris: Secondary | ICD-10-CM

## 2017-09-03 DIAGNOSIS — M109 Gout, unspecified: Secondary | ICD-10-CM | POA: Diagnosis not present

## 2017-09-03 DIAGNOSIS — Z8711 Personal history of peptic ulcer disease: Secondary | ICD-10-CM | POA: Diagnosis not present

## 2017-09-03 DIAGNOSIS — F419 Anxiety disorder, unspecified: Secondary | ICD-10-CM

## 2017-09-03 DIAGNOSIS — C9110 Chronic lymphocytic leukemia of B-cell type not having achieved remission: Secondary | ICD-10-CM

## 2017-09-03 DIAGNOSIS — Z86010 Personal history of colonic polyps: Secondary | ICD-10-CM | POA: Diagnosis not present

## 2017-09-03 DIAGNOSIS — Z85820 Personal history of malignant melanoma of skin: Secondary | ICD-10-CM | POA: Diagnosis not present

## 2017-09-03 DIAGNOSIS — I252 Old myocardial infarction: Secondary | ICD-10-CM | POA: Diagnosis not present

## 2017-09-03 DIAGNOSIS — E119 Type 2 diabetes mellitus without complications: Secondary | ICD-10-CM | POA: Diagnosis not present

## 2017-09-03 DIAGNOSIS — C9190 Lymphoid leukemia, unspecified not having achieved remission: Secondary | ICD-10-CM

## 2017-09-03 DIAGNOSIS — Z79899 Other long term (current) drug therapy: Secondary | ICD-10-CM

## 2017-09-03 DIAGNOSIS — E7800 Pure hypercholesterolemia, unspecified: Secondary | ICD-10-CM | POA: Diagnosis not present

## 2017-09-03 DIAGNOSIS — Z87891 Personal history of nicotine dependence: Secondary | ICD-10-CM

## 2017-09-03 DIAGNOSIS — Z7982 Long term (current) use of aspirin: Secondary | ICD-10-CM

## 2017-09-03 NOTE — Telephone Encounter (Signed)
Per 8/13 los.  Gave patient avs and  Calendars.

## 2017-09-11 DIAGNOSIS — C9110 Chronic lymphocytic leukemia of B-cell type not having achieved remission: Secondary | ICD-10-CM | POA: Diagnosis not present

## 2017-09-11 DIAGNOSIS — D509 Iron deficiency anemia, unspecified: Secondary | ICD-10-CM | POA: Diagnosis not present

## 2017-09-11 DIAGNOSIS — R195 Other fecal abnormalities: Secondary | ICD-10-CM | POA: Diagnosis not present

## 2017-09-11 DIAGNOSIS — Z86010 Personal history of colonic polyps: Secondary | ICD-10-CM | POA: Diagnosis not present

## 2017-09-12 DIAGNOSIS — C44519 Basal cell carcinoma of skin of other part of trunk: Secondary | ICD-10-CM | POA: Diagnosis not present

## 2017-09-12 DIAGNOSIS — D044 Carcinoma in situ of skin of scalp and neck: Secondary | ICD-10-CM | POA: Diagnosis not present

## 2017-10-04 DIAGNOSIS — C9110 Chronic lymphocytic leukemia of B-cell type not having achieved remission: Secondary | ICD-10-CM | POA: Diagnosis not present

## 2017-10-04 DIAGNOSIS — Z85828 Personal history of other malignant neoplasm of skin: Secondary | ICD-10-CM | POA: Diagnosis not present

## 2017-10-04 DIAGNOSIS — D0359 Melanoma in situ of other part of trunk: Secondary | ICD-10-CM | POA: Diagnosis not present

## 2017-10-04 DIAGNOSIS — E790 Hyperuricemia without signs of inflammatory arthritis and tophaceous disease: Secondary | ICD-10-CM | POA: Diagnosis not present

## 2017-10-04 DIAGNOSIS — Z85820 Personal history of malignant melanoma of skin: Secondary | ICD-10-CM | POA: Diagnosis not present

## 2017-10-04 DIAGNOSIS — R161 Splenomegaly, not elsewhere classified: Secondary | ICD-10-CM | POA: Diagnosis not present

## 2017-10-04 DIAGNOSIS — D0362 Melanoma in situ of left upper limb, including shoulder: Secondary | ICD-10-CM | POA: Diagnosis not present

## 2017-10-04 DIAGNOSIS — D72821 Monocytosis (symptomatic): Secondary | ICD-10-CM | POA: Diagnosis not present

## 2017-11-07 DIAGNOSIS — M5030 Other cervical disc degeneration, unspecified cervical region: Secondary | ICD-10-CM | POA: Diagnosis not present

## 2017-11-07 DIAGNOSIS — M542 Cervicalgia: Secondary | ICD-10-CM | POA: Diagnosis not present

## 2017-11-07 DIAGNOSIS — M4312 Spondylolisthesis, cervical region: Secondary | ICD-10-CM | POA: Diagnosis not present

## 2017-11-20 DIAGNOSIS — M542 Cervicalgia: Secondary | ICD-10-CM | POA: Diagnosis not present

## 2017-12-04 ENCOUNTER — Inpatient Hospital Stay: Payer: PPO | Attending: Oncology

## 2017-12-04 DIAGNOSIS — C9110 Chronic lymphocytic leukemia of B-cell type not having achieved remission: Secondary | ICD-10-CM

## 2017-12-04 DIAGNOSIS — C9190 Lymphoid leukemia, unspecified not having achieved remission: Secondary | ICD-10-CM | POA: Insufficient documentation

## 2017-12-04 LAB — COMPREHENSIVE METABOLIC PANEL
ALBUMIN: 4.3 g/dL (ref 3.5–5.0)
ALK PHOS: 89 U/L (ref 38–126)
ALT: 11 U/L (ref 0–44)
ANION GAP: 11 (ref 5–15)
AST: 15 U/L (ref 15–41)
BILIRUBIN TOTAL: 1 mg/dL (ref 0.3–1.2)
BUN: 23 mg/dL (ref 8–23)
CHLORIDE: 106 mmol/L (ref 98–111)
CO2: 25 mmol/L (ref 22–32)
CREATININE: 1.52 mg/dL — AB (ref 0.61–1.24)
Calcium: 10 mg/dL (ref 8.9–10.3)
GFR calc non Af Amer: 42 mL/min — ABNORMAL LOW (ref >60)
GFR, EST AFRICAN AMERICAN: 49 mL/min — AB (ref >60)
Glucose, Bld: 100 mg/dL — ABNORMAL HIGH (ref 70–99)
POTASSIUM: 4.2 mmol/L (ref 3.5–5.1)
Sodium: 142 mmol/L (ref 135–145)
Total Protein: 7.3 g/dL (ref 6.5–8.1)

## 2017-12-04 LAB — CBC WITH DIFFERENTIAL/PLATELET
BASOS ABS: 0 10*3/uL (ref 0.0–0.1)
Basophils Relative: 0 %
EOS PCT: 0 %
Eosinophils Absolute: 0 10*3/uL (ref 0.0–0.5)
HCT: 40 % (ref 39.0–52.0)
HEMOGLOBIN: 12.2 g/dL — AB (ref 13.0–17.0)
LYMPHS ABS: 148.5 10*3/uL — AB (ref 0.7–4.0)
Lymphocytes Relative: 89 %
MCH: 27.1 pg (ref 26.0–34.0)
MCHC: 30.5 g/dL (ref 30.0–36.0)
MCV: 88.9 fL (ref 80.0–100.0)
MONO ABS: 0 10*3/uL — AB (ref 0.1–1.0)
Monocytes Relative: 0 %
NEUTROS ABS: 18.4 10*3/uL — AB (ref 1.7–17.7)
Neutrophils Relative %: 11 %
Platelets: 148 10*3/uL — ABNORMAL LOW (ref 150–400)
RBC: 4.5 MIL/uL (ref 4.22–5.81)
RDW: 15.3 % (ref 11.5–15.5)
WBC: 166.9 10*3/uL — AB (ref 4.0–10.5)
nRBC: 0 % (ref 0.0–0.2)

## 2017-12-04 LAB — LACTATE DEHYDROGENASE: LDH: 191 U/L (ref 98–192)

## 2017-12-05 LAB — IGG, IGA, IGM
IGM (IMMUNOGLOBULIN M), SRM: 10 mg/dL — AB (ref 15–143)
IgA: 39 mg/dL — ABNORMAL LOW (ref 61–437)
IgG (Immunoglobin G), Serum: 439 mg/dL — ABNORMAL LOW (ref 700–1600)

## 2017-12-13 ENCOUNTER — Other Ambulatory Visit: Payer: Self-pay | Admitting: Physician Assistant

## 2017-12-13 DIAGNOSIS — L308 Other specified dermatitis: Secondary | ICD-10-CM | POA: Diagnosis not present

## 2017-12-13 DIAGNOSIS — D485 Neoplasm of uncertain behavior of skin: Secondary | ICD-10-CM | POA: Diagnosis not present

## 2017-12-13 DIAGNOSIS — L570 Actinic keratosis: Secondary | ICD-10-CM | POA: Diagnosis not present

## 2018-02-03 DIAGNOSIS — I10 Essential (primary) hypertension: Secondary | ICD-10-CM | POA: Diagnosis not present

## 2018-02-03 DIAGNOSIS — N183 Chronic kidney disease, stage 3 (moderate): Secondary | ICD-10-CM | POA: Diagnosis not present

## 2018-02-03 DIAGNOSIS — Z85820 Personal history of malignant melanoma of skin: Secondary | ICD-10-CM | POA: Diagnosis not present

## 2018-02-03 DIAGNOSIS — E782 Mixed hyperlipidemia: Secondary | ICD-10-CM | POA: Diagnosis not present

## 2018-02-03 DIAGNOSIS — E039 Hypothyroidism, unspecified: Secondary | ICD-10-CM | POA: Diagnosis not present

## 2018-02-03 DIAGNOSIS — I2510 Atherosclerotic heart disease of native coronary artery without angina pectoris: Secondary | ICD-10-CM | POA: Diagnosis not present

## 2018-02-03 DIAGNOSIS — E1122 Type 2 diabetes mellitus with diabetic chronic kidney disease: Secondary | ICD-10-CM | POA: Diagnosis not present

## 2018-02-03 DIAGNOSIS — Z8042 Family history of malignant neoplasm of prostate: Secondary | ICD-10-CM | POA: Diagnosis not present

## 2018-02-03 DIAGNOSIS — C9110 Chronic lymphocytic leukemia of B-cell type not having achieved remission: Secondary | ICD-10-CM | POA: Diagnosis not present

## 2018-02-03 DIAGNOSIS — K921 Melena: Secondary | ICD-10-CM | POA: Diagnosis not present

## 2018-02-03 DIAGNOSIS — E119 Type 2 diabetes mellitus without complications: Secondary | ICD-10-CM | POA: Diagnosis not present

## 2018-03-06 ENCOUNTER — Inpatient Hospital Stay: Payer: PPO | Attending: Oncology

## 2018-03-06 DIAGNOSIS — C9190 Lymphoid leukemia, unspecified not having achieved remission: Secondary | ICD-10-CM | POA: Insufficient documentation

## 2018-03-06 DIAGNOSIS — C9110 Chronic lymphocytic leukemia of B-cell type not having achieved remission: Secondary | ICD-10-CM

## 2018-03-06 LAB — COMPREHENSIVE METABOLIC PANEL
ALT: 8 U/L (ref 0–44)
AST: 13 U/L — AB (ref 15–41)
Albumin: 4.3 g/dL (ref 3.5–5.0)
Alkaline Phosphatase: 92 U/L (ref 38–126)
Anion gap: 9 (ref 5–15)
BILIRUBIN TOTAL: 1.1 mg/dL (ref 0.3–1.2)
BUN: 27 mg/dL — ABNORMAL HIGH (ref 8–23)
CHLORIDE: 106 mmol/L (ref 98–111)
CO2: 28 mmol/L (ref 22–32)
Calcium: 10.4 mg/dL — ABNORMAL HIGH (ref 8.9–10.3)
Creatinine, Ser: 1.42 mg/dL — ABNORMAL HIGH (ref 0.61–1.24)
GFR calc Af Amer: 55 mL/min — ABNORMAL LOW (ref >60)
GFR, EST NON AFRICAN AMERICAN: 47 mL/min — AB (ref >60)
Glucose, Bld: 105 mg/dL — ABNORMAL HIGH (ref 70–99)
Potassium: 3.9 mmol/L (ref 3.5–5.1)
Sodium: 143 mmol/L (ref 135–145)
Total Protein: 7 g/dL (ref 6.5–8.1)

## 2018-03-06 LAB — CBC WITH DIFFERENTIAL/PLATELET
Abs Immature Granulocytes: 0.2 10*3/uL — ABNORMAL HIGH (ref 0.00–0.07)
BASOS ABS: 0.1 10*3/uL (ref 0.0–0.1)
Basophils Relative: 0 %
EOS ABS: 0.2 10*3/uL (ref 0.0–0.5)
Eosinophils Relative: 0 %
HEMATOCRIT: 37.2 % — AB (ref 39.0–52.0)
Hemoglobin: 11.1 g/dL — ABNORMAL LOW (ref 13.0–17.0)
IMMATURE GRANULOCYTES: 0 %
LYMPHS ABS: 116.4 10*3/uL — AB (ref 0.7–4.0)
Lymphocytes Relative: 90 %
MCH: 26.1 pg (ref 26.0–34.0)
MCHC: 29.8 g/dL — ABNORMAL LOW (ref 30.0–36.0)
MCV: 87.5 fL (ref 80.0–100.0)
MONOS PCT: 7 %
Monocytes Absolute: 8.5 10*3/uL — ABNORMAL HIGH (ref 0.1–1.0)
NEUTROS ABS: 4.5 10*3/uL (ref 1.7–7.7)
NEUTROS PCT: 3 %
NRBC: 0 % (ref 0.0–0.2)
PLATELETS: 155 10*3/uL (ref 150–400)
RBC: 4.25 MIL/uL (ref 4.22–5.81)
RDW: 15.3 % (ref 11.5–15.5)
WBC: 129.9 10*3/uL (ref 4.0–10.5)

## 2018-03-06 LAB — LACTATE DEHYDROGENASE: LDH: 173 U/L (ref 98–192)

## 2018-03-07 LAB — IGG, IGA, IGM
IGM (IMMUNOGLOBULIN M), SRM: 10 mg/dL — AB (ref 15–143)
IgA: 35 mg/dL — ABNORMAL LOW (ref 61–437)
IgG (Immunoglobin G), Serum: 452 mg/dL — ABNORMAL LOW (ref 700–1600)

## 2018-03-18 DIAGNOSIS — L570 Actinic keratosis: Secondary | ICD-10-CM | POA: Diagnosis not present

## 2018-03-18 DIAGNOSIS — D229 Melanocytic nevi, unspecified: Secondary | ICD-10-CM | POA: Diagnosis not present

## 2018-04-04 DIAGNOSIS — D72828 Other elevated white blood cell count: Secondary | ICD-10-CM | POA: Diagnosis not present

## 2018-04-04 DIAGNOSIS — R7989 Other specified abnormal findings of blood chemistry: Secondary | ICD-10-CM | POA: Diagnosis not present

## 2018-04-04 DIAGNOSIS — D72821 Monocytosis (symptomatic): Secondary | ICD-10-CM | POA: Diagnosis not present

## 2018-04-04 DIAGNOSIS — Z85820 Personal history of malignant melanoma of skin: Secondary | ICD-10-CM | POA: Diagnosis not present

## 2018-04-04 DIAGNOSIS — R161 Splenomegaly, not elsewhere classified: Secondary | ICD-10-CM | POA: Diagnosis not present

## 2018-04-04 DIAGNOSIS — C9110 Chronic lymphocytic leukemia of B-cell type not having achieved remission: Secondary | ICD-10-CM | POA: Diagnosis not present

## 2018-04-04 DIAGNOSIS — Z86006 Personal history of melanoma in-situ: Secondary | ICD-10-CM | POA: Diagnosis not present

## 2018-04-04 DIAGNOSIS — E790 Hyperuricemia without signs of inflammatory arthritis and tophaceous disease: Secondary | ICD-10-CM | POA: Diagnosis not present

## 2018-04-04 DIAGNOSIS — Z85828 Personal history of other malignant neoplasm of skin: Secondary | ICD-10-CM | POA: Diagnosis not present

## 2018-06-04 ENCOUNTER — Other Ambulatory Visit: Payer: Self-pay

## 2018-06-04 ENCOUNTER — Inpatient Hospital Stay: Payer: PPO | Attending: Oncology

## 2018-06-04 DIAGNOSIS — C9190 Lymphoid leukemia, unspecified not having achieved remission: Secondary | ICD-10-CM | POA: Diagnosis not present

## 2018-06-04 DIAGNOSIS — C9110 Chronic lymphocytic leukemia of B-cell type not having achieved remission: Secondary | ICD-10-CM

## 2018-06-04 LAB — CBC WITH DIFFERENTIAL/PLATELET
Abs Immature Granulocytes: 0.22 10*3/uL — ABNORMAL HIGH (ref 0.00–0.07)
Basophils Absolute: 0.1 10*3/uL (ref 0.0–0.1)
Basophils Relative: 0 %
Eosinophils Absolute: 0.1 10*3/uL (ref 0.0–0.5)
Eosinophils Relative: 0 %
HCT: 39.8 % (ref 39.0–52.0)
Hemoglobin: 11.5 g/dL — ABNORMAL LOW (ref 13.0–17.0)
Immature Granulocytes: 0 %
Lymphocytes Relative: 95 %
Lymphs Abs: 141.4 10*3/uL — ABNORMAL HIGH (ref 0.7–4.0)
MCH: 25.1 pg — ABNORMAL LOW (ref 26.0–34.0)
MCHC: 28.9 g/dL — ABNORMAL LOW (ref 30.0–36.0)
MCV: 86.9 fL (ref 80.0–100.0)
Monocytes Absolute: 3.1 10*3/uL — ABNORMAL HIGH (ref 0.1–1.0)
Monocytes Relative: 2 %
Neutro Abs: 4.5 10*3/uL (ref 1.7–7.7)
Neutrophils Relative %: 3 %
Platelets: 134 10*3/uL — ABNORMAL LOW (ref 150–400)
RBC: 4.58 MIL/uL (ref 4.22–5.81)
RDW: 16.6 % — ABNORMAL HIGH (ref 11.5–15.5)
WBC: 149.4 10*3/uL (ref 4.0–10.5)
nRBC: 0 % (ref 0.0–0.2)

## 2018-06-04 LAB — COMPREHENSIVE METABOLIC PANEL
ALT: 13 U/L (ref 0–44)
AST: 15 U/L (ref 15–41)
Albumin: 4.5 g/dL (ref 3.5–5.0)
Alkaline Phosphatase: 92 U/L (ref 38–126)
Anion gap: 11 (ref 5–15)
BUN: 28 mg/dL — ABNORMAL HIGH (ref 8–23)
CO2: 27 mmol/L (ref 22–32)
Calcium: 9.7 mg/dL (ref 8.9–10.3)
Chloride: 105 mmol/L (ref 98–111)
Creatinine, Ser: 1.78 mg/dL — ABNORMAL HIGH (ref 0.61–1.24)
GFR calc Af Amer: 42 mL/min — ABNORMAL LOW (ref >60)
GFR calc non Af Amer: 36 mL/min — ABNORMAL LOW (ref >60)
Glucose, Bld: 95 mg/dL (ref 70–99)
Potassium: 4.1 mmol/L (ref 3.5–5.1)
Sodium: 143 mmol/L (ref 135–145)
Total Bilirubin: 1.2 mg/dL (ref 0.3–1.2)
Total Protein: 7.4 g/dL (ref 6.5–8.1)

## 2018-06-04 LAB — LACTATE DEHYDROGENASE: LDH: 196 U/L — ABNORMAL HIGH (ref 98–192)

## 2018-06-04 NOTE — Progress Notes (Signed)
Dr. Jana Hakim notified of critical lab value for WBC.

## 2018-06-05 LAB — IGG, IGA, IGM
IgA: 36 mg/dL — ABNORMAL LOW (ref 61–437)
IgG (Immunoglobin G), Serum: 478 mg/dL — ABNORMAL LOW (ref 603–1613)
IgM (Immunoglobulin M), Srm: 10 mg/dL — ABNORMAL LOW (ref 15–143)

## 2018-07-24 DIAGNOSIS — Z125 Encounter for screening for malignant neoplasm of prostate: Secondary | ICD-10-CM | POA: Diagnosis not present

## 2018-07-24 DIAGNOSIS — E781 Pure hyperglyceridemia: Secondary | ICD-10-CM | POA: Diagnosis not present

## 2018-07-24 DIAGNOSIS — I10 Essential (primary) hypertension: Secondary | ICD-10-CM | POA: Diagnosis not present

## 2018-07-24 DIAGNOSIS — E118 Type 2 diabetes mellitus with unspecified complications: Secondary | ICD-10-CM | POA: Diagnosis not present

## 2018-07-30 DIAGNOSIS — C9110 Chronic lymphocytic leukemia of B-cell type not having achieved remission: Secondary | ICD-10-CM | POA: Diagnosis not present

## 2018-07-30 DIAGNOSIS — Z8042 Family history of malignant neoplasm of prostate: Secondary | ICD-10-CM | POA: Diagnosis not present

## 2018-07-30 DIAGNOSIS — E039 Hypothyroidism, unspecified: Secondary | ICD-10-CM | POA: Diagnosis not present

## 2018-07-30 DIAGNOSIS — Z86010 Personal history of colonic polyps: Secondary | ICD-10-CM | POA: Diagnosis not present

## 2018-07-30 DIAGNOSIS — Z85820 Personal history of malignant melanoma of skin: Secondary | ICD-10-CM | POA: Diagnosis not present

## 2018-07-30 DIAGNOSIS — I10 Essential (primary) hypertension: Secondary | ICD-10-CM | POA: Diagnosis not present

## 2018-07-30 DIAGNOSIS — E118 Type 2 diabetes mellitus with unspecified complications: Secondary | ICD-10-CM | POA: Diagnosis not present

## 2018-07-30 DIAGNOSIS — E782 Mixed hyperlipidemia: Secondary | ICD-10-CM | POA: Diagnosis not present

## 2018-07-30 DIAGNOSIS — N189 Chronic kidney disease, unspecified: Secondary | ICD-10-CM | POA: Diagnosis not present

## 2018-07-30 DIAGNOSIS — I2510 Atherosclerotic heart disease of native coronary artery without angina pectoris: Secondary | ICD-10-CM | POA: Diagnosis not present

## 2018-09-04 ENCOUNTER — Other Ambulatory Visit: Payer: Self-pay

## 2018-09-04 ENCOUNTER — Inpatient Hospital Stay: Payer: PPO | Attending: Oncology

## 2018-09-04 DIAGNOSIS — I10 Essential (primary) hypertension: Secondary | ICD-10-CM | POA: Insufficient documentation

## 2018-09-04 DIAGNOSIS — Z7984 Long term (current) use of oral hypoglycemic drugs: Secondary | ICD-10-CM | POA: Diagnosis not present

## 2018-09-04 DIAGNOSIS — E785 Hyperlipidemia, unspecified: Secondary | ICD-10-CM | POA: Insufficient documentation

## 2018-09-04 DIAGNOSIS — E119 Type 2 diabetes mellitus without complications: Secondary | ICD-10-CM | POA: Insufficient documentation

## 2018-09-04 DIAGNOSIS — C9110 Chronic lymphocytic leukemia of B-cell type not having achieved remission: Secondary | ICD-10-CM | POA: Diagnosis not present

## 2018-09-04 DIAGNOSIS — I252 Old myocardial infarction: Secondary | ICD-10-CM | POA: Insufficient documentation

## 2018-09-04 DIAGNOSIS — Z85820 Personal history of malignant melanoma of skin: Secondary | ICD-10-CM | POA: Diagnosis not present

## 2018-09-04 DIAGNOSIS — Z79899 Other long term (current) drug therapy: Secondary | ICD-10-CM | POA: Insufficient documentation

## 2018-09-04 DIAGNOSIS — I2510 Atherosclerotic heart disease of native coronary artery without angina pectoris: Secondary | ICD-10-CM | POA: Diagnosis not present

## 2018-09-04 DIAGNOSIS — E7800 Pure hypercholesterolemia, unspecified: Secondary | ICD-10-CM | POA: Diagnosis not present

## 2018-09-04 DIAGNOSIS — Z8042 Family history of malignant neoplasm of prostate: Secondary | ICD-10-CM | POA: Insufficient documentation

## 2018-09-04 DIAGNOSIS — F419 Anxiety disorder, unspecified: Secondary | ICD-10-CM | POA: Insufficient documentation

## 2018-09-04 DIAGNOSIS — Z7982 Long term (current) use of aspirin: Secondary | ICD-10-CM | POA: Insufficient documentation

## 2018-09-04 DIAGNOSIS — E1165 Type 2 diabetes mellitus with hyperglycemia: Secondary | ICD-10-CM | POA: Diagnosis not present

## 2018-09-04 LAB — CBC WITH DIFFERENTIAL/PLATELET
Abs Immature Granulocytes: 0.16 10*3/uL — ABNORMAL HIGH (ref 0.00–0.07)
Basophils Absolute: 0.1 10*3/uL (ref 0.0–0.1)
Basophils Relative: 0 %
Eosinophils Absolute: 0.1 10*3/uL (ref 0.0–0.5)
Eosinophils Relative: 0 %
HCT: 37 % — ABNORMAL LOW (ref 39.0–52.0)
Hemoglobin: 10.9 g/dL — ABNORMAL LOW (ref 13.0–17.0)
Immature Granulocytes: 0 %
Lymphocytes Relative: 94 %
Lymphs Abs: 113.3 10*3/uL — ABNORMAL HIGH (ref 0.7–4.0)
MCH: 25.1 pg — ABNORMAL LOW (ref 26.0–34.0)
MCHC: 29.5 g/dL — ABNORMAL LOW (ref 30.0–36.0)
MCV: 85.3 fL (ref 80.0–100.0)
Monocytes Absolute: 2.6 10*3/uL — ABNORMAL HIGH (ref 0.1–1.0)
Monocytes Relative: 2 %
Neutro Abs: 4.1 10*3/uL (ref 1.7–7.7)
Neutrophils Relative %: 4 %
Platelets: 127 10*3/uL — ABNORMAL LOW (ref 150–400)
RBC: 4.34 MIL/uL (ref 4.22–5.81)
RDW: 16.3 % — ABNORMAL HIGH (ref 11.5–15.5)
WBC: 120.4 10*3/uL (ref 4.0–10.5)
nRBC: 0 % (ref 0.0–0.2)

## 2018-09-04 LAB — COMPREHENSIVE METABOLIC PANEL
ALT: 10 U/L (ref 0–44)
AST: 14 U/L — ABNORMAL LOW (ref 15–41)
Albumin: 4.3 g/dL (ref 3.5–5.0)
Alkaline Phosphatase: 71 U/L (ref 38–126)
Anion gap: 9 (ref 5–15)
BUN: 24 mg/dL — ABNORMAL HIGH (ref 8–23)
CO2: 27 mmol/L (ref 22–32)
Calcium: 9.7 mg/dL (ref 8.9–10.3)
Chloride: 107 mmol/L (ref 98–111)
Creatinine, Ser: 1.44 mg/dL — ABNORMAL HIGH (ref 0.61–1.24)
GFR calc Af Amer: 54 mL/min — ABNORMAL LOW (ref >60)
GFR calc non Af Amer: 46 mL/min — ABNORMAL LOW (ref >60)
Glucose, Bld: 92 mg/dL (ref 70–99)
Potassium: 4.4 mmol/L (ref 3.5–5.1)
Sodium: 143 mmol/L (ref 135–145)
Total Bilirubin: 0.9 mg/dL (ref 0.3–1.2)
Total Protein: 6.9 g/dL (ref 6.5–8.1)

## 2018-09-04 LAB — LACTATE DEHYDROGENASE: LDH: 150 U/L (ref 98–192)

## 2018-09-05 LAB — IGG, IGA, IGM
IgA: 32 mg/dL — ABNORMAL LOW (ref 61–437)
IgG (Immunoglobin G), Serum: 445 mg/dL — ABNORMAL LOW (ref 603–1613)
IgM (Immunoglobulin M), Srm: 8 mg/dL — ABNORMAL LOW (ref 15–143)

## 2018-09-15 ENCOUNTER — Telehealth: Payer: Self-pay | Admitting: Oncology

## 2018-09-15 NOTE — Telephone Encounter (Signed)
Unable to reach pt per 8/23 sch message - unable to change appt . No answer and no vmail for pt.

## 2018-09-17 NOTE — Progress Notes (Signed)
ID: [name]   DOB: December 09, 1940  MR#: [id number]  [id number]  PCP: Hulan Fess, MD GYN: SU:  OTHER MD: Fransico Him, Phebe Colla, [name]  HISTORY OF PRESENT ILLNESS: From the earlier summary notes:  The patient had had multiple liver function tests and electrolytes, but no complete blood count since 1998 (that one apparently was normal) until he was referred to [facility] for evaluation of muscle aches, possibly related to his cholesterol-lowering medication.  As part of the workup, Dr. Rockwell Alexandria obtained a complete blood count on Jun 15, 2002, which showed a hemoglobin of 13.3, MCV 79.5, platelets 178,000 and a white cell count of 37.3, with 80% lymphocytes  The absolute lymphocyte count was 30,000.   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  We tried to reach him to turn this into a virtual visit but his phone has not been working well lately.   He continues to refuse treatment.  He is mildly anemic and mildly thrombocytopenic as noted above with a very high white cell count.  Perhaps more importantly his IVIG is less than 500.  His ANC is 4.1  REVIEW OF SYSTEMS: [name] has no "B" symptoms.  He walks about 3 miles a day.  His diabetes is not well-controlled chiefly because he likes sweets and carbohydrates.  His kidneys reflect this.  He is not aware of any peripheral adenopathy.  He is concerned because his brother recently developed atrial fibrillation and also multiple of his brothers have developed prostate cancer.  He is making sure his PSAs checked yearly.  Detailed review of systems today was otherwise stable   PAST MEDICAL HISTORY: Past Medical History:  Diagnosis Date  . Anxiety   . 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 Coronary artery disease    s/p CABG 1998  . Diabetes mellitus without complication     Type II  . Dysplastic nevus 01/1996   melanoma 2013 followed by dermatologist Dr Denna Haggard  . Gout, unspecified 12/22/2010  . Hypertension   . Lymphocytic leukemia    Chronic followed by Dr Florene Glen at [facility]  . MI (myocardial infarction) 1998  . Mixed dyslipidemia   . Wears partial dentures   Significant for hypertension, diabetes, hypercholesterolemia, coronary artery disease status post coronary artery bypass graft in 1998, history of septoplasty, history of sleep apnea, which apparently has resolved with aggressive treatment of his rhinitis, history of colonic polyps, history of peptic ulcer disease and history of remote and minimal tobacco abuse.   PAST SURGICAL HISTORY: Past Surgical History:  Procedure Laterality Date  . CARDIAC CATHETERIZATION    . CHOLECYSTECTOMY  2010  . COLONOSCOPY    . CORONARY ARTERY BYPASS GRAFT  1998   X7  . HERNIA REPAIR  2010   rting hernia  . TRIGGER FINGER RELEASE Left 04/30/2014   Procedure: RELEASE A-1 PULLEYS LEFT INDEX AND LEFT LONG FINGERS ;  Surgeon: Daryll Brod, MD;  Location: [facility];  Service: Orthopedics;  Laterality: Left;    FAMILY HISTORY [name]'s father died at the age of 1 from "old age" according to [name].  His mother died at 41 with a cerebrovascular accident.  She had many children, of whom 12 made it to adulthood.  All of his brothers older than he (with 1 exception) have had prostate cancer.    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 His wife of >40 years, [name], died 2012/11/17. [name]  lives alone.. He has 2 children surviving.  One son was born with hyaline membrane and died suddenly at the age of 59, about 5 years ago.  Son [name] teaches poly-sci in [place]. He has a daughter, the patient's only grandchild, . Son [name] lives in [place]. He has significant psychosocial issues not documented here. The Ropers are [employer]ons.    ADVANCED DIRECTIVES:  in place  HEALTH MAINTENANCE: Social History   Tobacco Use  . Smoking status: Never Smoker  Substance Use Topics  . Alcohol use: No  . Drug use: No     Colonoscopy:  PSA: <4 per patient's report  Bone density:  Lipid panel:  No Known Allergies  Current Outpatient Medications  Medication Sig Dispense Refill  . aspirin 81 MG tablet Take 81 mg by mouth daily.    Marland Kitchen atorvastatin (LIPITOR) 40 MG tablet Take 40 mg by mouth daily.    . finasteride (PROSCAR) 5 MG tablet Take 5 mg by mouth daily.    . IRON, FERROUS GLUCONATE, PO Take by mouth. 365    . metFORMIN (GLUCOPHAGE) 500 MG tablet Take 500 mg by mouth 2 (two) times daily with a meal.    . metoprolol succinate (TOPROL-XL) 50 MG 24 hr tablet Take 50 mg by mouth daily. Take with or immediately following a meal.    . nateglinide (STARLIX) 60 MG tablet Take 60 mg by mouth 3 (three) times daily before meals.    . quinapril (ACCUPRIL) 10 MG tablet Take 10 mg by mouth at bedtime.     No current facility-administered medications for this visit.     OBJECTIVE: Middle-aged white man who appears stated age 78:   09/18/18 1318  BP: (!) 125/52  Pulse: (!) 59  Resp: 18  Temp: 98.5 F (36.9 C)  SpO2: 99%     Body mass index is 25.99 kg/m.    ECOG FS: 0  Sclerae unicteric, EOMs intact Oropharynx clear and moist There is movable rubbery right axillary lymph node measuring approximately 1-1/2 cm.  I do not palpate any other cervical supraclavicular axillary adenopathy Lungs no rales or rhonchi Heart regular rate and rhythm Abd soft, nontender, positive bowel sounds MSK no focal spinal tenderness, no upper extremity lymphedema Neuro: nonfocal, well oriented, appropriate affect   LAB RESULTS:  Lab Results  Component Value Date   WBC 120.4 (HH) 09/04/2018   NEUTROABS 4.1 09/04/2018   HGB 10.9 (L) 09/04/2018   HCT 37.0 (L) 09/04/2018   MCV 85.3 09/04/2018   PLT 127 (L) 09/04/2018      Chemistry      Component Value  Date/Time   NA 143 09/04/2018 1152   NA 142 08/28/2016 1123   K 4.4 09/04/2018 1152   K 4.6 08/28/2016 1123   CL 107 09/04/2018 1152   CL 109 (H) 07/08/2012 1321   CO2 27 09/04/2018 1152   CO2 25 08/28/2016 1123   BUN 24 (H) 09/04/2018 1152   BUN 22.9 08/28/2016 1123   CREATININE 1.44 (H) 09/04/2018 1152   CREATININE 1.5 (H) 08/28/2016 1123      Component Value Date/Time   CALCIUM 9.7 09/04/2018 1152   CALCIUM 10.0 08/28/2016 1123   ALKPHOS 71 09/04/2018 1152   ALKPHOS 77 08/28/2016 1123   AST 14 (L) 09/04/2018 1152   AST 17 08/28/2016 1123   ALT  10 09/04/2018 1152   ALT 12 08/28/2016 1123   BILITOT 0.9 09/04/2018 1152   BILITOT 1.20 08/28/2016 1123      No results found for: LABCA2  No components found for: LABCA125  No results for input(s): INR in the last 168 hours.  Urinalysis    Component Value Date/Time   LABSPEC 1.010 04/24/2007 1443   PHURINE 6.5 04/24/2007 1443   HGBUR Trace 04/24/2007 1443   BILIRUBINUR Negative 04/24/2007 1443   KETONESUR Negative 04/24/2007 1443   PROTEINUR Negative 04/24/2007 1443   NITRITE Negative 04/24/2007 1443   LEUKOCYTESUR Negative 04/24/2007 1443   STUDIES: No results found.   ASSESSMENT: 78 y.o. [place] man with a history of chronic lymphoid leukemia initially diagnosed in May 2004.  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  He has not accepted treatment so far.   PLAN:  [name] is now 16 years out from initial diagnosis of chronic lymphoid leukemia.  He has never accepted treatment.  He is mildly anemic and thrombocytopenic.  He has an immunoglobulin level less than 500.  He has an adequate ANC.  As usual I discussed the newer treatments for CLL and specifically venetoclax.  He will look it up and see if he is interested.  I also let him know about the [facility] available in [place] at present.  I am not sure  whether he would meet entry criteria however.  He is planning to move to [facility] [place] to be near his son perhaps early next year.  Until then we are continuing to check his lab work every 3 months that if he is still here a year from now I will see him then  He knows to call for any other issue that may develop before that time.  Calen Posch, Virgie Dad, MD  09/18/18 1:38 PM Medical Oncology and Hematology [facility] [address] [place], [place] [address] Tel. [phone]    Fax. [phone]

## 2018-09-18 ENCOUNTER — Other Ambulatory Visit: Payer: Self-pay

## 2018-09-18 ENCOUNTER — Inpatient Hospital Stay (HOSPITAL_BASED_OUTPATIENT_CLINIC_OR_DEPARTMENT_OTHER): Payer: PPO | Admitting: Oncology

## 2018-09-18 VITALS — BP 125/52 | HR 59 | Temp 98.5°F | Resp 18 | Wt 156.2 lb

## 2018-09-18 DIAGNOSIS — C9110 Chronic lymphocytic leukemia of B-cell type not having achieved remission: Secondary | ICD-10-CM | POA: Diagnosis not present

## 2018-09-19 ENCOUNTER — Telehealth: Payer: Self-pay | Admitting: Oncology

## 2018-09-19 NOTE — Telephone Encounter (Signed)
I could not reach patient regarding schedule  °

## 2018-10-03 DIAGNOSIS — E790 Hyperuricemia without signs of inflammatory arthritis and tophaceous disease: Secondary | ICD-10-CM | POA: Diagnosis not present

## 2018-10-03 DIAGNOSIS — D649 Anemia, unspecified: Secondary | ICD-10-CM | POA: Diagnosis not present

## 2018-10-03 DIAGNOSIS — Z79899 Other long term (current) drug therapy: Secondary | ICD-10-CM | POA: Diagnosis not present

## 2018-10-03 DIAGNOSIS — E119 Type 2 diabetes mellitus without complications: Secondary | ICD-10-CM | POA: Diagnosis not present

## 2018-10-03 DIAGNOSIS — C9110 Chronic lymphocytic leukemia of B-cell type not having achieved remission: Secondary | ICD-10-CM | POA: Diagnosis not present

## 2018-10-03 DIAGNOSIS — H43813 Vitreous degeneration, bilateral: Secondary | ICD-10-CM | POA: Diagnosis not present

## 2018-10-03 DIAGNOSIS — H2513 Age-related nuclear cataract, bilateral: Secondary | ICD-10-CM | POA: Diagnosis not present

## 2018-10-03 DIAGNOSIS — Z85820 Personal history of malignant melanoma of skin: Secondary | ICD-10-CM | POA: Diagnosis not present

## 2018-12-09 DIAGNOSIS — I10 Essential (primary) hypertension: Secondary | ICD-10-CM | POA: Diagnosis not present

## 2018-12-09 DIAGNOSIS — E118 Type 2 diabetes mellitus with unspecified complications: Secondary | ICD-10-CM | POA: Diagnosis not present

## 2018-12-09 DIAGNOSIS — Z85820 Personal history of malignant melanoma of skin: Secondary | ICD-10-CM | POA: Diagnosis not present

## 2018-12-09 DIAGNOSIS — I2510 Atherosclerotic heart disease of native coronary artery without angina pectoris: Secondary | ICD-10-CM | POA: Diagnosis not present

## 2018-12-09 DIAGNOSIS — Z86010 Personal history of colonic polyps: Secondary | ICD-10-CM | POA: Diagnosis not present

## 2018-12-09 DIAGNOSIS — N189 Chronic kidney disease, unspecified: Secondary | ICD-10-CM | POA: Diagnosis not present

## 2018-12-09 DIAGNOSIS — C9110 Chronic lymphocytic leukemia of B-cell type not having achieved remission: Secondary | ICD-10-CM | POA: Diagnosis not present

## 2018-12-09 DIAGNOSIS — E781 Pure hyperglyceridemia: Secondary | ICD-10-CM | POA: Diagnosis not present

## 2018-12-09 DIAGNOSIS — E782 Mixed hyperlipidemia: Secondary | ICD-10-CM | POA: Diagnosis not present

## 2018-12-09 DIAGNOSIS — E039 Hypothyroidism, unspecified: Secondary | ICD-10-CM | POA: Diagnosis not present

## 2018-12-09 DIAGNOSIS — Z8042 Family history of malignant neoplasm of prostate: Secondary | ICD-10-CM | POA: Diagnosis not present

## 2018-12-19 ENCOUNTER — Inpatient Hospital Stay: Payer: PPO | Attending: Oncology

## 2018-12-19 ENCOUNTER — Other Ambulatory Visit: Payer: Self-pay

## 2018-12-19 DIAGNOSIS — C9110 Chronic lymphocytic leukemia of B-cell type not having achieved remission: Secondary | ICD-10-CM | POA: Insufficient documentation

## 2018-12-19 LAB — LACTATE DEHYDROGENASE: LDH: 186 U/L (ref 98–192)

## 2018-12-20 LAB — IGG, IGA, IGM
IgA: 31 mg/dL — ABNORMAL LOW (ref 61–437)
IgG (Immunoglobin G), Serum: 430 mg/dL — ABNORMAL LOW (ref 603–1613)
IgM (Immunoglobulin M), Srm: 8 mg/dL — ABNORMAL LOW (ref 15–143)

## 2018-12-23 ENCOUNTER — Other Ambulatory Visit: Payer: Self-pay

## 2018-12-23 ENCOUNTER — Ambulatory Visit (INDEPENDENT_AMBULATORY_CARE_PROVIDER_SITE_OTHER): Payer: PPO | Admitting: Cardiology

## 2018-12-23 VITALS — BP 140/72 | HR 53 | Ht 65.0 in | Wt 161.0 lb

## 2018-12-23 DIAGNOSIS — E119 Type 2 diabetes mellitus without complications: Secondary | ICD-10-CM | POA: Diagnosis not present

## 2018-12-23 DIAGNOSIS — I10 Essential (primary) hypertension: Secondary | ICD-10-CM

## 2018-12-23 DIAGNOSIS — I2510 Atherosclerotic heart disease of native coronary artery without angina pectoris: Secondary | ICD-10-CM

## 2018-12-23 DIAGNOSIS — E785 Hyperlipidemia, unspecified: Secondary | ICD-10-CM

## 2018-12-23 NOTE — Patient Instructions (Signed)
Medication Instructions:  Your physician recommends that you continue on your current medications as directed. Please refer to the Current Medication list given to you today.  *If you need a refill on your cardiac medications before your next appointment, please call your pharmacy*  Lab Work: None ordered.  Testing/Procedures: None ordered.  Follow-Up: At [facility], you and your health needs are our priority.  As part of our continuing mission to provide you with exceptional heart care, we have created designated Provider Care Teams.  These Care Teams include your primary Cardiologist (physician) and Advanced Practice Providers (APPs -  Physician Assistants and Nurse Practitioners) who all work together to provide you with the care you need, when you need it.  Your next appointment:   1 year(s)  The format for your next appointment:   In Person  Provider:   You may see Traci Turner, MD or one of the following Advanced Practice Providers on your designated Care Team:    Dayna Dunn, PA-C  Michele Lenze, PA-C    

## 2018-12-23 NOTE — Progress Notes (Addendum)
Cardiology Office Note    Date:  12/23/2018   ID:  [name], DOB 29-Aug-1940, MRN [id number]  PCP:  Hulan Fess, MD  Cardiologist:  Fransico Him, MD   Chief Complaint  Patient presents with  . Coronary Artery Disease  . Hypertension  . Hyperlipidemia    History of Present Illness:  [name] is a 78 y.o. male who is being seen today for the evaluation of CAD at the request of Javier, Lennette Bihari, MD.  [name] is a 78 y.o. male with a history of ASCAD s/p CABG in 1998, dyslipidemia and HTN.  He has not been seen by Cardiology in over 5 years and is here to reestablish care.  He is here today for followup and is doing well.  He denies any chest pain or pressure, SOB, DOE, PND, orthopnea, LE edema, dizziness, palpitations or syncope. He occasionally will notice a skipped heart beat but only when he feels his pulse.  He is concerned at his brother was recently dx with afib.  He is compliant with his meds and is tolerating meds with no SE.     Past Medical History:  Diagnosis Date  . Anxiety   . 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 Coronary artery disease    s/p CABG 1998  . Diabetes mellitus without complication    Type II  . Dysplastic nevus 01/1996   melanoma 2013 followed by dermatologist Dr Denna Haggard  . Gout, unspecified 12/22/2010  . Hypertension   . Lymphocytic leukemia    Chronic followed by Dr Florene Glen at [facility]  . MI (myocardial infarction) 1998  . Mixed dyslipidemia   . Wears partial dentures     Past Surgical History:  Procedure Laterality Date  . CARDIAC CATHETERIZATION    . CHOLECYSTECTOMY  2010  . COLONOSCOPY    . CORONARY ARTERY BYPASS GRAFT  1998   X7  . HERNIA REPAIR  2010   rting hernia  . TRIGGER FINGER RELEASE Left 04/30/2014   Procedure: RELEASE A-1 PULLEYS LEFT INDEX AND LEFT LONG FINGERS ;  Surgeon: Daryll Brod, MD;  Location: [facility];  Service: Orthopedics;  Laterality: Left;    Current  Medications: Current Meds  Medication Sig  . glipiZIDE (GLUCOTROL) 5 MG tablet Take 2.5 mg by mouth every morning.  . IRON, FERROUS GLUCONATE, PO Take by mouth. 365  . levothyroxine (SYNTHROID) 25 MCG tablet Take 50 mcg by mouth daily.  . metFORMIN (GLUCOPHAGE) 500 MG tablet Take 500 mg by mouth 2 (two) times daily with a meal.  . metoprolol succinate (TOPROL-XL) 50 MG 24 hr tablet Take 50 mg by mouth daily. Take with or immediately following a meal.  . nateglinide (STARLIX) 60 MG tablet Take 60 mg by mouth 3 (three) times daily before meals.  . pravastatin (PRAVACHOL) 80 MG tablet Take 80 mg by mouth daily.  . quinapril (ACCUPRIL) 10 MG tablet Take 10 mg by mouth at bedtime.    Allergies:   Patient has no known allergies.   Social History   Socioeconomic History  . Marital status: Widowed    Spouse name: Not on file  . Number of children: Not on file  . Years of education: Not on file  . Highest education level: Not on file  Occupational History  . Not on file  Social Needs  . Financial resource strain: Not on file  . Food insecurity    Worry: Not on file  Inability: Not on file  . Transportation needs    Medical: Not on file    Non-medical: Not on file  Tobacco Use  . Smoking status: Never Smoker  Substance and Sexual Activity  . Alcohol use: No  . Drug use: No  . Sexual activity: Not on file  Lifestyle  . Physical activity    Days per week: Not on file    Minutes per session: Not on file  . Stress: Not on file  Relationships  . Social Herbalist on phone: Not on file    Gets together: Not on file    Attends religious service: Not on file    Active member of club or organization: Not on file    Attends meetings of clubs or organizations: Not on file    Relationship status: Not on file  Other Topics Concern  . Not on file  Social History Narrative  . Not on file     Family History:  The patient's family history is not on file.   ROS:   Please  see the history of present illness.    ROS All other systems reviewed and are negative.  No flowsheet data found.  PHYSICAL EXAM:   VS:  BP 140/72   Pulse (!) 53   Ht 5\' 5"  (1.651 m)   Wt 161 lb (73 kg)   BMI 26.79 kg/m    GEN: Well nourished, well developed, in no acute distress  HEENT: normal  Neck: no JVD, carotid bruits, or masses Cardiac: RRR; no murmurs, rubs, or gallops,no edema.  Intact distal pulses bilaterally.  Respiratory:  clear to auscultation bilaterally, normal work of breathing GI: soft, nontender, nondistended, + BS MS: no deformity or atrophy  Skin: warm and dry, no rash Neuro:  Alert and Oriented x 3, Strength and sensation are intact Psych: euthymic mood, full affect  Wt Readings from Last 3 Encounters:  12/23/18 161 lb (73 kg)  09/18/18 156 lb 3.2 oz (70.9 kg)  09/03/17 159 lb 4.8 oz (72.3 kg)      Studies/Labs Reviewed:   EKG:  EKG is ordered today.  The ekg ordered today demonstrates SB with inferolateral infarct and no ST changes  Recent Labs: 09/04/2018: ALT 10; BUN 24; Creatinine, Ser 1.44; Hemoglobin 10.9; Platelets 127; Potassium 4.4; Sodium 143   Lipid Panel    Component Value Date/Time   CHOL 126 10/20/2013 0834   TRIG 338.0 (H) 10/20/2013 0834   HDL 21.30 (L) 10/20/2013 0834   CHOLHDL 6 10/20/2013 0834   VLDL 67.6 (H) 10/20/2013 0834   LDLCALC 37 06/08/2013 0951   LDLDIRECT 42.8 10/20/2013 0834    Additional studies/ records that were reviewed today include:  none    ASSESSMENT:    1. Atherosclerosis of native coronary artery of native heart without angina pectoris   2. Essential hypertension, benign   3. Hyperlipidemia LDL goal <70   4. DM type 2, goal HbA1c < 7% (HCC)      PLAN:  In order of problems listed above:  1.  ASCAD -s/p remote CABG -denies any angina and walks 4-5 miles daily -continue BB and statin -he cannot take ASA due to hx of GI bleeding on ASA  2.  HTN -BP controlled -continue Metoprolol  succinate 50mg  daily and Quinapril 10mg  daily  3.  HLD -LDL goal < 70 -LDL was 44 last month -continue Pravastatin 80mg  daily  4.  Type 2 Dm -followed by PCP -HbA1C 6.7  in July 2020 -continue Glipizide 2.5mg  daily and Metformin 500mg  BID  5.  Irregular heart beat -he occasionally will notice a skipped heart beat but only when he feels his puls -he is concerned at his brother was recently dx with afib -I have encouraged him to by the [facility] device and notify me if he has any irregular heart beats. -he does have a sinus arrhythmia on exam with respirations but EKG is normal   Medication Adjustments/Labs and Tests Ordered: Current medicines are reviewed at length with the patient today.  Concerns regarding medicines are outlined above.  Medication changes, Labs and Tests ordered today are listed in the Patient Instructions below.  There are no Patient Instructions on file for this visit.   Signed, Fransico Him, MD  12/23/2018 9:03 AM    [facility] [place], [place], [place]  [address] Phone: [phone]; Fax: [phone]

## 2018-12-26 ENCOUNTER — Encounter: Payer: Self-pay | Admitting: Oncology

## 2018-12-31 ENCOUNTER — Other Ambulatory Visit: Payer: Self-pay | Admitting: Oncology

## 2018-12-31 DIAGNOSIS — C9110 Chronic lymphocytic leukemia of B-cell type not having achieved remission: Secondary | ICD-10-CM

## 2019-01-01 ENCOUNTER — Other Ambulatory Visit: Payer: Self-pay | Admitting: Physician Assistant

## 2019-01-01 DIAGNOSIS — D485 Neoplasm of uncertain behavior of skin: Secondary | ICD-10-CM | POA: Diagnosis not present

## 2019-01-01 DIAGNOSIS — L570 Actinic keratosis: Secondary | ICD-10-CM | POA: Diagnosis not present

## 2019-01-02 ENCOUNTER — Inpatient Hospital Stay: Payer: PPO | Attending: Oncology

## 2019-01-02 ENCOUNTER — Encounter: Payer: Self-pay | Admitting: Oncology

## 2019-01-02 ENCOUNTER — Other Ambulatory Visit: Payer: Self-pay

## 2019-01-02 DIAGNOSIS — C9110 Chronic lymphocytic leukemia of B-cell type not having achieved remission: Secondary | ICD-10-CM | POA: Diagnosis not present

## 2019-01-02 LAB — CBC WITH DIFFERENTIAL/PLATELET
Abs Immature Granulocytes: 0.24 10*3/uL — ABNORMAL HIGH (ref 0.00–0.07)
Basophils Absolute: 0.1 10*3/uL (ref 0.0–0.1)
Basophils Relative: 0 %
Eosinophils Absolute: 0.2 10*3/uL (ref 0.0–0.5)
Eosinophils Relative: 0 %
HCT: 39.7 % (ref 39.0–52.0)
Hemoglobin: 11.8 g/dL — ABNORMAL LOW (ref 13.0–17.0)
Immature Granulocytes: 0 %
Lymphocytes Relative: 93 %
Lymphs Abs: 135.7 10*3/uL — ABNORMAL HIGH (ref 0.7–4.0)
MCH: 25.5 pg — ABNORMAL LOW (ref 26.0–34.0)
MCHC: 29.7 g/dL — ABNORMAL LOW (ref 30.0–36.0)
MCV: 85.9 fL (ref 80.0–100.0)
Monocytes Absolute: 5.5 10*3/uL — ABNORMAL HIGH (ref 0.1–1.0)
Monocytes Relative: 4 %
Neutro Abs: 5 10*3/uL (ref 1.7–7.7)
Neutrophils Relative %: 3 %
Platelets: 136 10*3/uL — ABNORMAL LOW (ref 150–400)
RBC: 4.62 MIL/uL (ref 4.22–5.81)
RDW: 16.6 % — ABNORMAL HIGH (ref 11.5–15.5)
WBC: 146.7 10*3/uL (ref 4.0–10.5)
nRBC: 0 % (ref 0.0–0.2)

## 2019-01-02 LAB — COMPREHENSIVE METABOLIC PANEL
ALT: 9 U/L (ref 0–44)
AST: 16 U/L (ref 15–41)
Albumin: 4.4 g/dL (ref 3.5–5.0)
Alkaline Phosphatase: 93 U/L (ref 38–126)
Anion gap: 11 (ref 5–15)
BUN: 29 mg/dL — ABNORMAL HIGH (ref 8–23)
CO2: 25 mmol/L (ref 22–32)
Calcium: 9.2 mg/dL (ref 8.9–10.3)
Chloride: 105 mmol/L (ref 98–111)
Creatinine, Ser: 1.75 mg/dL — ABNORMAL HIGH (ref 0.61–1.24)
GFR calc Af Amer: 42 mL/min — ABNORMAL LOW (ref >60)
GFR calc non Af Amer: 36 mL/min — ABNORMAL LOW (ref >60)
Glucose, Bld: 196 mg/dL — ABNORMAL HIGH (ref 70–99)
Potassium: 4 mmol/L (ref 3.5–5.1)
Sodium: 141 mmol/L (ref 135–145)
Total Bilirubin: 1.2 mg/dL (ref 0.3–1.2)
Total Protein: 7 g/dL (ref 6.5–8.1)

## 2019-01-02 LAB — LACTATE DEHYDROGENASE: LDH: 175 U/L (ref 98–192)

## 2019-01-03 LAB — IGG, IGA, IGM
IgA: 33 mg/dL — ABNORMAL LOW (ref 61–437)
IgG (Immunoglobin G), Serum: 449 mg/dL — ABNORMAL LOW (ref 603–1613)
IgM (Immunoglobulin M), Srm: 10 mg/dL — ABNORMAL LOW (ref 15–143)

## 2019-01-27 DIAGNOSIS — Z03818 Encounter for observation for suspected exposure to other biological agents ruled out: Secondary | ICD-10-CM | POA: Diagnosis not present

## 2019-03-20 ENCOUNTER — Other Ambulatory Visit: Payer: PPO

## 2019-03-31 ENCOUNTER — Ambulatory Visit: Payer: PPO | Attending: Internal Medicine

## 2019-03-31 DIAGNOSIS — Z23 Encounter for immunization: Secondary | ICD-10-CM | POA: Insufficient documentation

## 2019-03-31 NOTE — Progress Notes (Signed)
   Covid-19 Vaccination Clinic  Name:  [name]    MRN: [id number] DOB: 1940-09-12  03/31/2019  [name] was observed post Covid-19 immunization for 15 minutes without incident. He was provided with Vaccine Information Sheet and instruction to access the V-Safe system.   [name] was instructed to call 911 with any severe reactions post vaccine: Marland Kitchen Difficulty breathing  . Swelling of face and throat  . A fast heartbeat  . A bad rash all over body  . Dizziness and weakness   Immunizations Administered    Name Date Dose VIS Date Route   Pfizer COVID-19 Vaccine 03/31/2019  2:33 PM 0.3 mL 01/02/2019 Intramuscular   Manufacturer: [place]   Lot: [id number]   [place]: [id number]

## 2019-04-17 DIAGNOSIS — C9110 Chronic lymphocytic leukemia of B-cell type not having achieved remission: Secondary | ICD-10-CM | POA: Diagnosis not present

## 2019-04-17 DIAGNOSIS — Z85820 Personal history of malignant melanoma of skin: Secondary | ICD-10-CM | POA: Diagnosis not present

## 2019-04-17 DIAGNOSIS — Z85828 Personal history of other malignant neoplasm of skin: Secondary | ICD-10-CM | POA: Diagnosis not present

## 2019-04-17 DIAGNOSIS — E790 Hyperuricemia without signs of inflammatory arthritis and tophaceous disease: Secondary | ICD-10-CM | POA: Diagnosis not present

## 2019-04-17 DIAGNOSIS — D720 Genetic anomalies of leukocytes: Secondary | ICD-10-CM | POA: Diagnosis not present

## 2019-06-17 ENCOUNTER — Inpatient Hospital Stay: Payer: PPO | Attending: Oncology

## 2019-06-17 ENCOUNTER — Other Ambulatory Visit: Payer: Self-pay

## 2019-06-17 DIAGNOSIS — C9110 Chronic lymphocytic leukemia of B-cell type not having achieved remission: Secondary | ICD-10-CM | POA: Diagnosis not present

## 2019-06-17 LAB — CBC WITH DIFFERENTIAL/PLATELET
Abs Immature Granulocytes: 0.18 10*3/uL — ABNORMAL HIGH (ref 0.00–0.07)
Basophils Absolute: 0.1 10*3/uL (ref 0.0–0.1)
Basophils Relative: 0 %
Eosinophils Absolute: 0.1 10*3/uL (ref 0.0–0.5)
Eosinophils Relative: 0 %
HCT: 37.3 % — ABNORMAL LOW (ref 39.0–52.0)
Hemoglobin: 11.1 g/dL — ABNORMAL LOW (ref 13.0–17.0)
Immature Granulocytes: 0 %
Lymphocytes Relative: 93 %
Lymphs Abs: 119.5 10*3/uL — ABNORMAL HIGH (ref 0.7–4.0)
MCH: 25.8 pg — ABNORMAL LOW (ref 26.0–34.0)
MCHC: 29.8 g/dL — ABNORMAL LOW (ref 30.0–36.0)
MCV: 86.7 fL (ref 80.0–100.0)
Monocytes Absolute: 5.2 10*3/uL — ABNORMAL HIGH (ref 0.1–1.0)
Monocytes Relative: 4 %
Neutro Abs: 4.3 10*3/uL (ref 1.7–7.7)
Neutrophils Relative %: 3 %
Platelets: 133 10*3/uL — ABNORMAL LOW (ref 150–400)
RBC: 4.3 MIL/uL (ref 4.22–5.81)
RDW: 16.3 % — ABNORMAL HIGH (ref 11.5–15.5)
WBC: 129.4 10*3/uL (ref 4.0–10.5)
nRBC: 0 % (ref 0.0–0.2)

## 2019-06-17 LAB — COMPREHENSIVE METABOLIC PANEL
ALT: 16 U/L (ref 0–44)
AST: 16 U/L (ref 15–41)
Albumin: 4.2 g/dL (ref 3.5–5.0)
Alkaline Phosphatase: 80 U/L (ref 38–126)
Anion gap: 7 (ref 5–15)
BUN: 25 mg/dL — ABNORMAL HIGH (ref 8–23)
CO2: 27 mmol/L (ref 22–32)
Calcium: 9.6 mg/dL (ref 8.9–10.3)
Chloride: 106 mmol/L (ref 98–111)
Creatinine, Ser: 1.69 mg/dL — ABNORMAL HIGH (ref 0.61–1.24)
GFR calc Af Amer: 44 mL/min — ABNORMAL LOW (ref >60)
GFR calc non Af Amer: 38 mL/min — ABNORMAL LOW (ref >60)
Glucose, Bld: 127 mg/dL — ABNORMAL HIGH (ref 70–99)
Potassium: 4.9 mmol/L (ref 3.5–5.1)
Sodium: 140 mmol/L (ref 135–145)
Total Bilirubin: 1.3 mg/dL — ABNORMAL HIGH (ref 0.3–1.2)
Total Protein: 6.7 g/dL (ref 6.5–8.1)

## 2019-06-17 LAB — LACTATE DEHYDROGENASE: LDH: 169 U/L (ref 98–192)

## 2019-06-18 LAB — IGG, IGA, IGM
IgA: 33 mg/dL — ABNORMAL LOW (ref 61–437)
IgG (Immunoglobin G), Serum: 423 mg/dL — ABNORMAL LOW (ref 603–1613)
IgM (Immunoglobulin M), Srm: 9 mg/dL — ABNORMAL LOW (ref 15–143)

## 2019-08-14 DIAGNOSIS — Z7984 Long term (current) use of oral hypoglycemic drugs: Secondary | ICD-10-CM | POA: Diagnosis not present

## 2019-08-14 DIAGNOSIS — I10 Essential (primary) hypertension: Secondary | ICD-10-CM | POA: Diagnosis not present

## 2019-08-14 DIAGNOSIS — E119 Type 2 diabetes mellitus without complications: Secondary | ICD-10-CM | POA: Diagnosis not present

## 2019-08-19 DIAGNOSIS — E782 Mixed hyperlipidemia: Secondary | ICD-10-CM | POA: Diagnosis not present

## 2019-08-19 DIAGNOSIS — Z86010 Personal history of colonic polyps: Secondary | ICD-10-CM | POA: Diagnosis not present

## 2019-08-19 DIAGNOSIS — E039 Hypothyroidism, unspecified: Secondary | ICD-10-CM | POA: Diagnosis not present

## 2019-08-19 DIAGNOSIS — Z7984 Long term (current) use of oral hypoglycemic drugs: Secondary | ICD-10-CM | POA: Diagnosis not present

## 2019-08-19 DIAGNOSIS — C9110 Chronic lymphocytic leukemia of B-cell type not having achieved remission: Secondary | ICD-10-CM | POA: Diagnosis not present

## 2019-08-19 DIAGNOSIS — Z0000 Encounter for general adult medical examination without abnormal findings: Secondary | ICD-10-CM | POA: Diagnosis not present

## 2019-08-19 DIAGNOSIS — R8290 Unspecified abnormal findings in urine: Secondary | ICD-10-CM | POA: Diagnosis not present

## 2019-08-19 DIAGNOSIS — E118 Type 2 diabetes mellitus with unspecified complications: Secondary | ICD-10-CM | POA: Diagnosis not present

## 2019-08-19 DIAGNOSIS — I2510 Atherosclerotic heart disease of native coronary artery without angina pectoris: Secondary | ICD-10-CM | POA: Diagnosis not present

## 2019-08-19 DIAGNOSIS — Z85820 Personal history of malignant melanoma of skin: Secondary | ICD-10-CM | POA: Diagnosis not present

## 2019-08-19 DIAGNOSIS — N183 Chronic kidney disease, stage 3 unspecified: Secondary | ICD-10-CM | POA: Diagnosis not present

## 2019-09-16 NOTE — Progress Notes (Signed)
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     He is mildly anemic though that may be at least partly due to his chronic kidney disease.  He is also mildly thrombocytopenic but with no platelet count under 100.  As expected he has a very high white cell count.  Perhaps more importantly his IVIG is less than 500.  His ANC is 4.1   REVIEW OF SYSTEMS: [name] has decided against moving to the [facility] area to be closer to his son there.  He just does not like that area that much.  He does tell me his son [name] there is doing very well as is his granddaughter who is now studying [place].  [name] recently drove all the way to [place] and [place] to visit his [place] son who unfortunately does have significant mental issues and "there is no solution for that".  [name] also up to care of his 53 year old sister who died from lung cancer some months ago.  He was her primary caregiver.  He has received the Pfizer vaccine x2 and is planning to get the booster soon.  He would like to have his antibodies tested.  He understands he is unlikely to make good antibodies and therefore he really ought to consider himself not vaccinated.  He is puzzled why his hemoglobin A1c is no better than 6.7 since he walks about 5 miles every day and has a very careful diet.  In fact his weight is down a little bit.  He was recently switched to [place].  Aside from these issues a detailed review of systems today was stable.   HISTORY OF PRESENT ILLNESS: From the earlier summary notes:  The patient had had multiple liver function tests and electrolytes, but no complete blood count since 1998 (that one  apparently was normal) until he was referred to [facility] for evaluation of muscle aches, possibly related to his cholesterol-lowering medication.  As part of the workup, Dr. Rockwell Alexandria obtained a complete blood count on Jun 15, 2002, which showed a hemoglobin of 13.3, MCV 79.5, platelets 178,000 and a white cell count of 37.3, with 80% lymphocytes  The absolute lymphocyte count was 30,000.   His subsequent history is detailed below   PAST MEDICAL HISTORY: Past Medical History:  Diagnosis Date  . Anxiety   . 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 Coronary artery disease    s/p CABG 1998  . Diabetes mellitus without complication    Type II  . Dysplastic nevus 01/1996   melanoma 2013 followed by dermatologist Dr Denna Haggard  . Gout, unspecified 12/22/2010  . Hypertension   . Lymphocytic leukemia    Chronic followed by Dr Florene Glen at [facility]  . MI (myocardial infarction) 1998  . Mixed dyslipidemia   . Wears partial dentures   Significant for hypertension, diabetes, hypercholesterolemia, coronary artery disease status post coronary artery bypass graft in 1998, history of septoplasty, history of sleep apnea, which apparently has resolved with aggressive treatment of his rhinitis, history of colonic polyps, history of peptic ulcer disease and history of remote and minimal tobacco abuse.    PAST SURGICAL HISTORY: Past Surgical History:  Procedure Laterality Date  . CARDIAC CATHETERIZATION    . CHOLECYSTECTOMY  2010  . COLONOSCOPY    . CORONARY ARTERY BYPASS GRAFT  1998   X7  . HERNIA REPAIR  2010   rting hernia  . TRIGGER FINGER RELEASE Left 04/30/2014   Procedure: RELEASE A-1 PULLEYS LEFT INDEX AND LEFT LONG FINGERS ;  Surgeon: Daryll Brod, MD;  Location: [facility];  Service: Orthopedics;  Laterality: Left;    FAMILY HISTORY Mr. Stavely's father died at the age of 63 from "old age" according to Mr. Baltzell.  His mother died at 38 with a  cerebrovascular accident.  She had many children, of whom 12 made it to adulthood.  All of his brothers older than he (with 1 exception) have had prostate cancer.     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 His wife of >40 years, [name], died December 11, 2012. [name]  lives alone but is considering eventually moving to a retirement community.. He has 2 children surviving.  One son was born with hyaline membrane and died suddenly at the age of 37.  Son [name] teaches poly-sci in [place].  [name] has a daughter, the patient's only grandchild. Son [name] lives in [place]. He has significant psychosocial issues not documented here. The Ropers are Mormons.     ADVANCED DIRECTIVES: in place   HEALTH MAINTENANCE: Social History   Tobacco Use  . Smoking status: Never Smoker  Substance Use Topics  . Alcohol use: No  . Drug use: No     Colonoscopy:  PSA: <4 per patient's report  Bone density:  Lipid panel:  No Known Allergies  Current Outpatient Medications  Medication Sig Dispense Refill  . empagliflozin (JARDIANCE) 10 MG TABS tablet Take 1 tablet (10 mg total) by mouth daily before breakfast. 30 tablet   . IRON, FERROUS GLUCONATE, PO Take by mouth. 365    . levothyroxine (SYNTHROID) 25 MCG tablet Take 50 mcg by mouth daily.    . metFORMIN (GLUCOPHAGE) 500 MG tablet Take 500 mg by mouth 2 (two) times daily with a meal.    . metoprolol succinate (TOPROL-XL) 50 MG 24 hr tablet Take 50 mg by mouth daily. Take with or immediately following a meal.    . nateglinide (STARLIX) 60 MG tablet Take 60 mg by mouth 3 (three) times daily before meals.    . pravastatin (PRAVACHOL) 80 MG tablet Take 80 mg by mouth daily.    . quinapril (ACCUPRIL) 10 MG tablet Take 10 mg by mouth at bedtime.     No current facility-administered medications for this visit.    OBJECTIVE: White man who appears well Vitals:   09/17/19 1150  BP: 115/61  Pulse:  (!) 58  Resp: 18  Temp: 98.3 F (36.8 C)  SpO2: 100%     Body mass index is 24.86 kg/m.    ECOG FS: 1  Sclerae unicteric, EOMs intact Wearing a mask No cervical or supraclavicular adenopathy, no axillary adenopathy Lungs no rales or rhonchi Heart regular rate and rhythm Abd soft, nontender, positive bowel sounds MSK no focal spinal tenderness, no upper extremity lymphedema Neuro: nonfocal, well oriented, appropriate affect   LAB RESULTS:  Lab Results  Component Value Date   WBC 154.9 (HH) 09/17/2019   NEUTROABS PENDING 09/17/2019   HGB 12.2 (L) 09/17/2019   HCT 40.4 09/17/2019   MCV 89.0 09/17/2019   PLT 146 (L) 09/17/2019  Chemistry      Component Value Date/Time   NA 140 06/17/2019 1103   NA 142 08/28/2016 1123   K 4.9 06/17/2019 1103   K 4.6 08/28/2016 1123   CL 106 06/17/2019 1103   CL 109 (H) 07/08/2012 1321   CO2 27 06/17/2019 1103   CO2 25 08/28/2016 1123   BUN 25 (H) 06/17/2019 1103   BUN 22.9 08/28/2016 1123   CREATININE 1.69 (H) 06/17/2019 1103   CREATININE 1.5 (H) 08/28/2016 1123      Component Value Date/Time   CALCIUM 9.6 06/17/2019 1103   CALCIUM 10.0 08/28/2016 1123   ALKPHOS 80 06/17/2019 1103   ALKPHOS 77 08/28/2016 1123   AST 16 06/17/2019 1103   AST 17 08/28/2016 1123   ALT 16 06/17/2019 1103   ALT 12 08/28/2016 1123   BILITOT 1.3 (H) 06/17/2019 1103   BILITOT 1.20 08/28/2016 1123      No results found for: LABCA2  No components found for: LABCA125  No results for input(s): INR in the last 168 hours.  Urinalysis    Component Value Date/Time   LABSPEC 1.010 04/24/2007 1443   PHURINE 6.5 04/24/2007 1443   HGBUR Trace 04/24/2007 1443   BILIRUBINUR Negative 04/24/2007 1443   KETONESUR Negative 04/24/2007 1443   PROTEINUR Negative 04/24/2007 1443   NITRITE Negative 04/24/2007 1443   LEUKOCYTESUR Negative 04/24/2007 1443    STUDIES: No results found.   ASSESSMENT: 79 y.o. [place] man with a history of chronic  lymphoid leukemia initially diagnosed in May 2004.  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  He has not accepted treatment so far.    PLAN:  [name] is now 17 years out from initial diagnosis of chronic lymphoid leukemia.  He has never been treated specifically for this although on occasion he has received steroids for other reasons and of course he has responded to those.  He has mild anemia possibly due to his renal insufficiency.  He has mild thrombocytopenia which is really not a concern.  His white cell count is high but he has a good ANC.  His total IgG is less than 500 and he understands he is very unlikely to make antibodies to his COVID-19 vaccines.  He would like to have his antibodies tested after he gets the booster and I wrote him a prescription for that.  I asked him to make sure to send me the results so I can include them in his chart.  Basically I have suggested that he consider himself not vaccinated.  We are going to continue to check his counts on an every 28-month basis and he will see me again in 1 year.  He knows to call for any other issue that may develop before the next visit.  Total encounter time 25 minutes.*  Mckynlee Luse, Virgie Dad, MD  09/17/19 12:22 PM Medical Oncology and Hematology [facility] [place], [place] [address] Tel. [phone]    Fax. [phone]   I, Wilburn Mylar, am acting as scribe for Dr. Virgie Dad. Rillie Riffel.  I, Lurline Del MD, have reviewed the above documentation for accuracy and completeness, and I agree with the above.    *Total Encounter Time as defined by the Centers for Medicare and Medicaid Services includes, in addition to the face-to-face time of a patient visit (documented in the note above) non-face-to-face time: obtaining and reviewing outside history,  ordering and reviewing medications, tests or  procedures, care coordination (communications with other health care professionals or caregivers) and documentation in the medical record.

## 2019-09-17 ENCOUNTER — Other Ambulatory Visit: Payer: Self-pay

## 2019-09-17 ENCOUNTER — Inpatient Hospital Stay: Payer: PPO | Attending: Oncology | Admitting: Oncology

## 2019-09-17 ENCOUNTER — Inpatient Hospital Stay: Payer: PPO

## 2019-09-17 VITALS — BP 115/61 | HR 58 | Temp 98.3°F | Resp 18 | Ht 65.0 in | Wt 149.4 lb

## 2019-09-17 DIAGNOSIS — Z9049 Acquired absence of other specified parts of digestive tract: Secondary | ICD-10-CM | POA: Diagnosis not present

## 2019-09-17 DIAGNOSIS — N183 Chronic kidney disease, stage 3 unspecified: Secondary | ICD-10-CM | POA: Insufficient documentation

## 2019-09-17 DIAGNOSIS — I129 Hypertensive chronic kidney disease with stage 1 through stage 4 chronic kidney disease, or unspecified chronic kidney disease: Secondary | ICD-10-CM | POA: Insufficient documentation

## 2019-09-17 DIAGNOSIS — Z85820 Personal history of malignant melanoma of skin: Secondary | ICD-10-CM | POA: Diagnosis not present

## 2019-09-17 DIAGNOSIS — C9110 Chronic lymphocytic leukemia of B-cell type not having achieved remission: Secondary | ICD-10-CM | POA: Diagnosis not present

## 2019-09-17 DIAGNOSIS — Z8711 Personal history of peptic ulcer disease: Secondary | ICD-10-CM | POA: Diagnosis not present

## 2019-09-17 DIAGNOSIS — Z8719 Personal history of other diseases of the digestive system: Secondary | ICD-10-CM | POA: Diagnosis not present

## 2019-09-17 DIAGNOSIS — D696 Thrombocytopenia, unspecified: Secondary | ICD-10-CM | POA: Insufficient documentation

## 2019-09-17 DIAGNOSIS — I252 Old myocardial infarction: Secondary | ICD-10-CM | POA: Diagnosis not present

## 2019-09-17 DIAGNOSIS — Z79899 Other long term (current) drug therapy: Secondary | ICD-10-CM | POA: Diagnosis not present

## 2019-09-17 DIAGNOSIS — Z86018 Personal history of other benign neoplasm: Secondary | ICD-10-CM | POA: Insufficient documentation

## 2019-09-17 DIAGNOSIS — D631 Anemia in chronic kidney disease: Secondary | ICD-10-CM | POA: Insufficient documentation

## 2019-09-17 LAB — COMPREHENSIVE METABOLIC PANEL
ALT: 11 U/L (ref 0–44)
AST: 17 U/L (ref 15–41)
Albumin: 4.3 g/dL (ref 3.5–5.0)
Alkaline Phosphatase: 75 U/L (ref 38–126)
Anion gap: 8 (ref 5–15)
BUN: 32 mg/dL — ABNORMAL HIGH (ref 8–23)
CO2: 26 mmol/L (ref 22–32)
Calcium: 10.2 mg/dL (ref 8.9–10.3)
Chloride: 105 mmol/L (ref 98–111)
Creatinine, Ser: 1.98 mg/dL — ABNORMAL HIGH (ref 0.61–1.24)
GFR calc Af Amer: 36 mL/min — ABNORMAL LOW (ref >60)
GFR calc non Af Amer: 31 mL/min — ABNORMAL LOW (ref >60)
Glucose, Bld: 107 mg/dL — ABNORMAL HIGH (ref 70–99)
Potassium: 4.5 mmol/L (ref 3.5–5.1)
Sodium: 139 mmol/L (ref 135–145)
Total Bilirubin: 1.4 mg/dL — ABNORMAL HIGH (ref 0.3–1.2)
Total Protein: 7 g/dL (ref 6.5–8.1)

## 2019-09-17 LAB — CBC WITH DIFFERENTIAL/PLATELET
Abs Immature Granulocytes: 0.3 10*3/uL — ABNORMAL HIGH (ref 0.00–0.07)
Basophils Absolute: 0.1 10*3/uL (ref 0.0–0.1)
Basophils Relative: 0 %
Eosinophils Absolute: 0.2 10*3/uL (ref 0.0–0.5)
Eosinophils Relative: 0 %
HCT: 40.4 % (ref 39.0–52.0)
Hemoglobin: 12.2 g/dL — ABNORMAL LOW (ref 13.0–17.0)
Immature Granulocytes: 0 %
Lymphocytes Relative: 94 %
Lymphs Abs: 143.8 10*3/uL — ABNORMAL HIGH (ref 0.7–4.0)
MCH: 26.9 pg (ref 26.0–34.0)
MCHC: 30.2 g/dL (ref 30.0–36.0)
MCV: 89 fL (ref 80.0–100.0)
Monocytes Absolute: 5.2 10*3/uL — ABNORMAL HIGH (ref 0.1–1.0)
Monocytes Relative: 3 %
Neutro Abs: 5.3 10*3/uL (ref 1.7–7.7)
Neutrophils Relative %: 3 %
Platelets: 146 10*3/uL — ABNORMAL LOW (ref 150–400)
RBC: 4.54 MIL/uL (ref 4.22–5.81)
RDW: 15.8 % — ABNORMAL HIGH (ref 11.5–15.5)
WBC: 154.9 10*3/uL (ref 4.0–10.5)
nRBC: 0 % (ref 0.0–0.2)

## 2019-09-17 LAB — LACTATE DEHYDROGENASE: LDH: 176 U/L (ref 98–192)

## 2019-09-18 ENCOUNTER — Telehealth: Payer: Self-pay | Admitting: Oncology

## 2019-09-18 LAB — IGG, IGA, IGM
IgA: 32 mg/dL — ABNORMAL LOW (ref 61–437)
IgG (Immunoglobin G), Serum: 445 mg/dL — ABNORMAL LOW (ref 603–1613)
IgM (Immunoglobulin M), Srm: 8 mg/dL — ABNORMAL LOW (ref 15–143)

## 2019-09-18 NOTE — Telephone Encounter (Signed)
Scheduled appts per 8/26 los. Pt confirmed appt dates and times.

## 2019-10-20 DIAGNOSIS — E785 Hyperlipidemia, unspecified: Secondary | ICD-10-CM | POA: Diagnosis not present

## 2019-10-20 DIAGNOSIS — C9110 Chronic lymphocytic leukemia of B-cell type not having achieved remission: Secondary | ICD-10-CM | POA: Diagnosis not present

## 2019-10-20 DIAGNOSIS — Z79899 Other long term (current) drug therapy: Secondary | ICD-10-CM | POA: Diagnosis not present

## 2019-10-20 DIAGNOSIS — R161 Splenomegaly, not elsewhere classified: Secondary | ICD-10-CM | POA: Diagnosis not present

## 2019-10-20 DIAGNOSIS — E1165 Type 2 diabetes mellitus with hyperglycemia: Secondary | ICD-10-CM | POA: Diagnosis not present

## 2019-10-20 DIAGNOSIS — Z85828 Personal history of other malignant neoplasm of skin: Secondary | ICD-10-CM | POA: Diagnosis not present

## 2019-10-20 DIAGNOSIS — R944 Abnormal results of kidney function studies: Secondary | ICD-10-CM | POA: Diagnosis not present

## 2019-10-20 DIAGNOSIS — E119 Type 2 diabetes mellitus without complications: Secondary | ICD-10-CM | POA: Diagnosis not present

## 2019-10-20 DIAGNOSIS — Z23 Encounter for immunization: Secondary | ICD-10-CM | POA: Diagnosis not present

## 2019-10-20 DIAGNOSIS — Z86006 Personal history of melanoma in-situ: Secondary | ICD-10-CM | POA: Diagnosis not present

## 2019-10-20 DIAGNOSIS — D720 Genetic anomalies of leukocytes: Secondary | ICD-10-CM | POA: Diagnosis not present

## 2019-10-20 DIAGNOSIS — Z7984 Long term (current) use of oral hypoglycemic drugs: Secondary | ICD-10-CM | POA: Diagnosis not present

## 2019-10-20 DIAGNOSIS — E790 Hyperuricemia without signs of inflammatory arthritis and tophaceous disease: Secondary | ICD-10-CM | POA: Diagnosis not present

## 2019-10-20 DIAGNOSIS — I10 Essential (primary) hypertension: Secondary | ICD-10-CM | POA: Diagnosis not present

## 2019-10-29 DIAGNOSIS — Z0000 Encounter for general adult medical examination without abnormal findings: Secondary | ICD-10-CM | POA: Diagnosis not present

## 2019-10-29 DIAGNOSIS — Z85820 Personal history of malignant melanoma of skin: Secondary | ICD-10-CM | POA: Diagnosis not present

## 2019-10-29 DIAGNOSIS — E782 Mixed hyperlipidemia: Secondary | ICD-10-CM | POA: Diagnosis not present

## 2019-10-29 DIAGNOSIS — Z86010 Personal history of colonic polyps: Secondary | ICD-10-CM | POA: Diagnosis not present

## 2019-10-29 DIAGNOSIS — Z131 Encounter for screening for diabetes mellitus: Secondary | ICD-10-CM | POA: Diagnosis not present

## 2019-10-29 DIAGNOSIS — N183 Chronic kidney disease, stage 3 unspecified: Secondary | ICD-10-CM | POA: Diagnosis not present

## 2019-10-29 DIAGNOSIS — R8290 Unspecified abnormal findings in urine: Secondary | ICD-10-CM | POA: Diagnosis not present

## 2019-10-29 DIAGNOSIS — I2510 Atherosclerotic heart disease of native coronary artery without angina pectoris: Secondary | ICD-10-CM | POA: Diagnosis not present

## 2019-10-29 DIAGNOSIS — C9110 Chronic lymphocytic leukemia of B-cell type not having achieved remission: Secondary | ICD-10-CM | POA: Diagnosis not present

## 2019-10-29 DIAGNOSIS — E118 Type 2 diabetes mellitus with unspecified complications: Secondary | ICD-10-CM | POA: Diagnosis not present

## 2019-10-29 DIAGNOSIS — E039 Hypothyroidism, unspecified: Secondary | ICD-10-CM | POA: Diagnosis not present

## 2019-10-29 DIAGNOSIS — Z7984 Long term (current) use of oral hypoglycemic drugs: Secondary | ICD-10-CM | POA: Diagnosis not present

## 2019-10-30 DIAGNOSIS — N1832 Chronic kidney disease, stage 3b: Secondary | ICD-10-CM | POA: Diagnosis not present

## 2019-10-30 DIAGNOSIS — C9110 Chronic lymphocytic leukemia of B-cell type not having achieved remission: Secondary | ICD-10-CM | POA: Diagnosis not present

## 2019-10-30 DIAGNOSIS — D631 Anemia in chronic kidney disease: Secondary | ICD-10-CM | POA: Diagnosis not present

## 2019-10-30 DIAGNOSIS — N189 Chronic kidney disease, unspecified: Secondary | ICD-10-CM | POA: Diagnosis not present

## 2019-10-30 DIAGNOSIS — I129 Hypertensive chronic kidney disease with stage 1 through stage 4 chronic kidney disease, or unspecified chronic kidney disease: Secondary | ICD-10-CM | POA: Diagnosis not present

## 2019-11-03 ENCOUNTER — Other Ambulatory Visit: Payer: Self-pay | Admitting: Nephrology

## 2019-11-03 DIAGNOSIS — N1832 Chronic kidney disease, stage 3b: Secondary | ICD-10-CM

## 2019-11-07 ENCOUNTER — Ambulatory Visit: Payer: PPO | Attending: Internal Medicine

## 2019-11-07 DIAGNOSIS — Z23 Encounter for immunization: Secondary | ICD-10-CM

## 2019-11-07 NOTE — Progress Notes (Signed)
   Covid-19 Vaccination Clinic  Name:  SHAUGHN THOMLEY    MRN: [id number] DOB: Mar 23, 1940  11/07/2019  Mr. Buntyn was observed post Covid-19 immunization for 15 minutes without incident. He was provided with Vaccine Information Sheet and instruction to access the V-Safe system.   Mr. Stroupe was instructed to call 911 with any severe reactions post vaccine: Marland Kitchen Difficulty breathing  . Swelling of face and throat  . A fast heartbeat  . A bad rash all over body  . Dizziness and weakness

## 2019-11-12 ENCOUNTER — Ambulatory Visit
Admission: RE | Admit: 2019-11-12 | Discharge: 2019-11-12 | Disposition: A | Discharge status: Home / Self Care | Payer: PPO | Source: Ambulatory Visit | Attending: Nephrology | Admitting: Nephrology

## 2019-11-12 DIAGNOSIS — N210 Calculus in bladder: Secondary | ICD-10-CM | POA: Diagnosis not present

## 2019-11-12 DIAGNOSIS — N183 Chronic kidney disease, stage 3 unspecified: Secondary | ICD-10-CM | POA: Diagnosis not present

## 2019-11-12 DIAGNOSIS — N1832 Chronic kidney disease, stage 3b: Secondary | ICD-10-CM

## 2019-11-12 DIAGNOSIS — N400 Enlarged prostate without lower urinary tract symptoms: Secondary | ICD-10-CM | POA: Diagnosis not present

## 2019-11-12 DIAGNOSIS — N281 Cyst of kidney, acquired: Secondary | ICD-10-CM | POA: Diagnosis not present

## 2019-11-25 ENCOUNTER — Encounter: Payer: Self-pay | Admitting: Oncology

## 2019-11-27 ENCOUNTER — Other Ambulatory Visit: Payer: Self-pay | Admitting: Oncology

## 2019-11-27 ENCOUNTER — Telehealth: Payer: Self-pay

## 2019-11-27 DIAGNOSIS — C9110 Chronic lymphocytic leukemia of B-cell type not having achieved remission: Secondary | ICD-10-CM

## 2019-11-27 NOTE — Progress Notes (Signed)
[name] called with some concerns regarding a slightly elevated kappa lambda ratio.  I left him a voicemail reassuring him and we are adding myeloma studies to every one of his next set of labs.

## 2019-11-27 NOTE — Telephone Encounter (Signed)
Pt returned call from Dr Jana Hakim. This LPN referred to Dr Magrinat's phone note and gave pt info. Pt verbalized thanks and understanding.

## 2019-12-16 ENCOUNTER — Encounter: Payer: Self-pay | Admitting: Neurology

## 2019-12-16 DIAGNOSIS — R200 Anesthesia of skin: Secondary | ICD-10-CM | POA: Diagnosis not present

## 2019-12-21 ENCOUNTER — Inpatient Hospital Stay: Payer: PPO | Attending: Oncology

## 2019-12-21 ENCOUNTER — Other Ambulatory Visit: Payer: Self-pay

## 2019-12-21 DIAGNOSIS — C9110 Chronic lymphocytic leukemia of B-cell type not having achieved remission: Secondary | ICD-10-CM

## 2019-12-21 LAB — CBC WITH DIFFERENTIAL/PLATELET
Abs Immature Granulocytes: 0.12 10*3/uL — ABNORMAL HIGH (ref 0.00–0.07)
Basophils Absolute: 0.1 10*3/uL (ref 0.0–0.1)
Basophils Relative: 0 %
Eosinophils Absolute: 0.1 10*3/uL (ref 0.0–0.5)
Eosinophils Relative: 0 %
HCT: 34.9 % — ABNORMAL LOW (ref 39.0–52.0)
Hemoglobin: 10.6 g/dL — ABNORMAL LOW (ref 13.0–17.0)
Immature Granulocytes: 0 %
Lymphocytes Relative: 92 %
Lymphs Abs: 87 10*3/uL — ABNORMAL HIGH (ref 0.7–4.0)
MCH: 26.7 pg (ref 26.0–34.0)
MCHC: 30.4 g/dL (ref 30.0–36.0)
MCV: 87.9 fL (ref 80.0–100.0)
Monocytes Absolute: 3.9 10*3/uL — ABNORMAL HIGH (ref 0.1–1.0)
Monocytes Relative: 4 %
Neutro Abs: 3.9 10*3/uL (ref 1.7–7.7)
Neutrophils Relative %: 4 %
Platelets: 123 10*3/uL — ABNORMAL LOW (ref 150–400)
RBC: 3.97 MIL/uL — ABNORMAL LOW (ref 4.22–5.81)
RDW: 15 % (ref 11.5–15.5)
WBC: 95.2 10*3/uL (ref 4.0–10.5)
nRBC: 0 % (ref 0.0–0.2)

## 2019-12-21 LAB — COMPREHENSIVE METABOLIC PANEL
ALT: 11 U/L (ref 0–44)
AST: 16 U/L (ref 15–41)
Albumin: 3.9 g/dL (ref 3.5–5.0)
Alkaline Phosphatase: 75 U/L (ref 38–126)
Anion gap: 13 (ref 5–15)
BUN: 26 mg/dL — ABNORMAL HIGH (ref 8–23)
CO2: 25 mmol/L (ref 22–32)
Calcium: 9.4 mg/dL (ref 8.9–10.3)
Chloride: 105 mmol/L (ref 98–111)
Creatinine, Ser: 1.4 mg/dL — ABNORMAL HIGH (ref 0.61–1.24)
GFR, Estimated: 51 mL/min — ABNORMAL LOW (ref >60)
Glucose, Bld: 171 mg/dL — ABNORMAL HIGH (ref 70–99)
Potassium: 4.3 mmol/L (ref 3.5–5.1)
Sodium: 143 mmol/L (ref 135–145)
Total Bilirubin: 1.1 mg/dL (ref 0.3–1.2)
Total Protein: 6.6 g/dL (ref 6.5–8.1)

## 2019-12-21 LAB — LACTATE DEHYDROGENASE: LDH: 251 U/L — ABNORMAL HIGH (ref 98–192)

## 2019-12-22 LAB — MULTIPLE MYELOMA PANEL, SERUM
Albumin SerPl Elph-Mcnc: 3.8 g/dL (ref 2.9–4.4)
Albumin/Glob SerPl: 1.9 — ABNORMAL HIGH (ref 0.7–1.7)
Alpha 1: 0.2 g/dL (ref 0.0–0.4)
Alpha2 Glob SerPl Elph-Mcnc: 0.7 g/dL (ref 0.4–1.0)
B-Globulin SerPl Elph-Mcnc: 0.9 g/dL (ref 0.7–1.3)
Gamma Glob SerPl Elph-Mcnc: 0.4 g/dL (ref 0.4–1.8)
Globulin, Total: 2.1 g/dL — ABNORMAL LOW (ref 2.2–3.9)
IgA: 30 mg/dL — ABNORMAL LOW (ref 61–437)
IgG (Immunoglobin G), Serum: 400 mg/dL — ABNORMAL LOW (ref 603–1613)
IgM (Immunoglobulin M), Srm: 7 mg/dL — ABNORMAL LOW (ref 15–143)
Total Protein ELP: 5.9 g/dL — ABNORMAL LOW (ref 6.0–8.5)

## 2019-12-22 LAB — IGG, IGA, IGM
IgA: 29 mg/dL — ABNORMAL LOW (ref 61–437)
IgG (Immunoglobin G), Serum: 369 mg/dL — ABNORMAL LOW (ref 603–1613)
IgM (Immunoglobulin M), Srm: 7 mg/dL — ABNORMAL LOW (ref 15–143)

## 2019-12-29 LAB — KAPPA/LAMBDA LIGHT CHAINS, FREE, WITH RATIO, 24HR. URINE
FR KAPPA LT CH,24HR: 18.88 mg/24 hr
FR LAMBDA LT CH,24HR: 3.41 mg/24 hr
Free Kappa Lt Chains,Ur: 9.68 mg/L (ref 0.63–113.79)
Free Kappa/Lambda Ratio: 5.53 (ref 1.03–31.76)
Free Lambda Lt Chains,Ur: 1.75 mg/L (ref 0.47–11.77)
Total Volume: 1950

## 2019-12-30 ENCOUNTER — Other Ambulatory Visit: Payer: Self-pay

## 2019-12-30 DIAGNOSIS — R202 Paresthesia of skin: Secondary | ICD-10-CM

## 2020-01-04 DIAGNOSIS — L989 Disorder of the skin and subcutaneous tissue, unspecified: Secondary | ICD-10-CM | POA: Diagnosis not present

## 2020-01-08 ENCOUNTER — Encounter: Payer: Self-pay | Admitting: Physician Assistant

## 2020-01-11 ENCOUNTER — Telehealth: Payer: Self-pay | Admitting: Physician Assistant

## 2020-01-11 NOTE — Telephone Encounter (Signed)
Patient returned call to infusion clinic hotline. I spoke with the patient who stated that he neither have COVID symptoms nor he tested positive. He was just replying my chart message from [place], [place], [place].

## 2020-01-27 ENCOUNTER — Encounter: Payer: PPO | Admitting: Neurology

## 2020-02-08 ENCOUNTER — Ambulatory Visit: Payer: PPO | Admitting: Dietician

## 2020-03-13 DIAGNOSIS — B029 Zoster without complications: Secondary | ICD-10-CM | POA: Diagnosis not present

## 2020-03-21 ENCOUNTER — Other Ambulatory Visit: Payer: PPO

## 2020-04-08 DIAGNOSIS — C9110 Chronic lymphocytic leukemia of B-cell type not having achieved remission: Secondary | ICD-10-CM | POA: Diagnosis not present

## 2020-04-08 DIAGNOSIS — Z951 Presence of aortocoronary bypass graft: Secondary | ICD-10-CM | POA: Diagnosis not present

## 2020-04-08 DIAGNOSIS — G4700 Insomnia, unspecified: Secondary | ICD-10-CM | POA: Diagnosis not present

## 2020-04-08 DIAGNOSIS — N189 Chronic kidney disease, unspecified: Secondary | ICD-10-CM | POA: Diagnosis not present

## 2020-04-12 DIAGNOSIS — E782 Mixed hyperlipidemia: Secondary | ICD-10-CM | POA: Diagnosis not present

## 2020-04-12 DIAGNOSIS — E118 Type 2 diabetes mellitus with unspecified complications: Secondary | ICD-10-CM | POA: Diagnosis not present

## 2020-04-15 DIAGNOSIS — E785 Hyperlipidemia, unspecified: Secondary | ICD-10-CM | POA: Diagnosis not present

## 2020-04-15 DIAGNOSIS — C9110 Chronic lymphocytic leukemia of B-cell type not having achieved remission: Secondary | ICD-10-CM | POA: Diagnosis not present

## 2020-04-15 DIAGNOSIS — Z85828 Personal history of other malignant neoplasm of skin: Secondary | ICD-10-CM | POA: Diagnosis not present

## 2020-04-15 DIAGNOSIS — Z79899 Other long term (current) drug therapy: Secondary | ICD-10-CM | POA: Diagnosis not present

## 2020-04-15 DIAGNOSIS — D720 Genetic anomalies of leukocytes: Secondary | ICD-10-CM | POA: Diagnosis not present

## 2020-04-15 DIAGNOSIS — Z7984 Long term (current) use of oral hypoglycemic drugs: Secondary | ICD-10-CM | POA: Diagnosis not present

## 2020-04-15 DIAGNOSIS — E790 Hyperuricemia without signs of inflammatory arthritis and tophaceous disease: Secondary | ICD-10-CM | POA: Diagnosis not present

## 2020-04-15 DIAGNOSIS — E119 Type 2 diabetes mellitus without complications: Secondary | ICD-10-CM | POA: Diagnosis not present

## 2020-04-15 DIAGNOSIS — I10 Essential (primary) hypertension: Secondary | ICD-10-CM | POA: Diagnosis not present

## 2020-04-15 DIAGNOSIS — D72821 Monocytosis (symptomatic): Secondary | ICD-10-CM | POA: Diagnosis not present

## 2020-05-31 DIAGNOSIS — E1122 Type 2 diabetes mellitus with diabetic chronic kidney disease: Secondary | ICD-10-CM | POA: Diagnosis not present

## 2020-05-31 DIAGNOSIS — E559 Vitamin D deficiency, unspecified: Secondary | ICD-10-CM | POA: Diagnosis not present

## 2020-05-31 DIAGNOSIS — D631 Anemia in chronic kidney disease: Secondary | ICD-10-CM | POA: Diagnosis not present

## 2020-05-31 DIAGNOSIS — N1832 Chronic kidney disease, stage 3b: Secondary | ICD-10-CM | POA: Diagnosis not present

## 2020-05-31 DIAGNOSIS — C9110 Chronic lymphocytic leukemia of B-cell type not having achieved remission: Secondary | ICD-10-CM | POA: Diagnosis not present

## 2020-05-31 DIAGNOSIS — I129 Hypertensive chronic kidney disease with stage 1 through stage 4 chronic kidney disease, or unspecified chronic kidney disease: Secondary | ICD-10-CM | POA: Diagnosis not present

## 2020-06-21 ENCOUNTER — Inpatient Hospital Stay: Payer: PPO | Attending: Oncology

## 2020-06-21 ENCOUNTER — Other Ambulatory Visit: Payer: Self-pay

## 2020-06-21 DIAGNOSIS — E782 Mixed hyperlipidemia: Secondary | ICD-10-CM | POA: Diagnosis not present

## 2020-06-21 DIAGNOSIS — C9110 Chronic lymphocytic leukemia of B-cell type not having achieved remission: Secondary | ICD-10-CM

## 2020-06-21 DIAGNOSIS — E781 Pure hyperglyceridemia: Secondary | ICD-10-CM | POA: Diagnosis not present

## 2020-06-21 DIAGNOSIS — D509 Iron deficiency anemia, unspecified: Secondary | ICD-10-CM | POA: Diagnosis not present

## 2020-06-21 DIAGNOSIS — N183 Chronic kidney disease, stage 3 unspecified: Secondary | ICD-10-CM | POA: Diagnosis not present

## 2020-06-21 DIAGNOSIS — I10 Essential (primary) hypertension: Secondary | ICD-10-CM | POA: Diagnosis not present

## 2020-06-21 DIAGNOSIS — E1122 Type 2 diabetes mellitus with diabetic chronic kidney disease: Secondary | ICD-10-CM | POA: Diagnosis not present

## 2020-06-21 DIAGNOSIS — E039 Hypothyroidism, unspecified: Secondary | ICD-10-CM | POA: Diagnosis not present

## 2020-06-21 DIAGNOSIS — N189 Chronic kidney disease, unspecified: Secondary | ICD-10-CM | POA: Diagnosis not present

## 2020-06-21 DIAGNOSIS — G4700 Insomnia, unspecified: Secondary | ICD-10-CM | POA: Diagnosis not present

## 2020-06-21 DIAGNOSIS — I2510 Atherosclerotic heart disease of native coronary artery without angina pectoris: Secondary | ICD-10-CM | POA: Diagnosis not present

## 2020-06-21 LAB — CBC WITH DIFFERENTIAL/PLATELET
Abs Immature Granulocytes: 0.16 10*3/uL — ABNORMAL HIGH (ref 0.00–0.07)
Basophils Absolute: 0.1 10*3/uL (ref 0.0–0.1)
Basophils Relative: 0 %
Eosinophils Absolute: 0.1 10*3/uL (ref 0.0–0.5)
Eosinophils Relative: 0 %
HCT: 36.8 % — ABNORMAL LOW (ref 39.0–52.0)
Hemoglobin: 10.9 g/dL — ABNORMAL LOW (ref 13.0–17.0)
Immature Granulocytes: 0 %
Lymphocytes Relative: 93 %
Lymphs Abs: 101.6 10*3/uL — ABNORMAL HIGH (ref 0.7–4.0)
MCH: 26 pg (ref 26.0–34.0)
MCHC: 29.6 g/dL — ABNORMAL LOW (ref 30.0–36.0)
MCV: 87.6 fL (ref 80.0–100.0)
Monocytes Absolute: 2.7 10*3/uL — ABNORMAL HIGH (ref 0.1–1.0)
Monocytes Relative: 3 %
Neutro Abs: 3.8 10*3/uL (ref 1.7–7.7)
Neutrophils Relative %: 4 %
Platelets: 120 10*3/uL — ABNORMAL LOW (ref 150–400)
RBC: 4.2 MIL/uL — ABNORMAL LOW (ref 4.22–5.81)
RDW: 16 % — ABNORMAL HIGH (ref 11.5–15.5)
WBC: 108.5 10*3/uL (ref 4.0–10.5)
nRBC: 0 % (ref 0.0–0.2)

## 2020-06-21 LAB — COMPREHENSIVE METABOLIC PANEL
ALT: 11 U/L (ref 0–44)
AST: 18 U/L (ref 15–41)
Albumin: 4.1 g/dL (ref 3.5–5.0)
Alkaline Phosphatase: 66 U/L (ref 38–126)
Anion gap: 9 (ref 5–15)
BUN: 27 mg/dL — ABNORMAL HIGH (ref 8–23)
CO2: 27 mmol/L (ref 22–32)
Calcium: 9.7 mg/dL (ref 8.9–10.3)
Chloride: 106 mmol/L (ref 98–111)
Creatinine, Ser: 1.49 mg/dL — ABNORMAL HIGH (ref 0.61–1.24)
GFR, Estimated: 47 mL/min — ABNORMAL LOW (ref >60)
Glucose, Bld: 128 mg/dL — ABNORMAL HIGH (ref 70–99)
Potassium: 4.4 mmol/L (ref 3.5–5.1)
Sodium: 142 mmol/L (ref 135–145)
Total Bilirubin: 1.1 mg/dL (ref 0.3–1.2)
Total Protein: 6.6 g/dL (ref 6.5–8.1)

## 2020-06-21 LAB — LACTATE DEHYDROGENASE: LDH: 173 U/L (ref 98–192)

## 2020-06-22 LAB — IGG, IGA, IGM
IgA: 29 mg/dL — ABNORMAL LOW (ref 61–437)
IgG (Immunoglobin G), Serum: 397 mg/dL — ABNORMAL LOW (ref 603–1613)
IgM (Immunoglobulin M), Srm: 8 mg/dL — ABNORMAL LOW (ref 15–143)

## 2020-06-24 LAB — MULTIPLE MYELOMA PANEL, SERUM
Albumin SerPl Elph-Mcnc: 4.1 g/dL (ref 2.9–4.4)
Albumin/Glob SerPl: 2.1 — ABNORMAL HIGH (ref 0.7–1.7)
Alpha 1: 0.2 g/dL (ref 0.0–0.4)
Alpha2 Glob SerPl Elph-Mcnc: 0.7 g/dL (ref 0.4–1.0)
B-Globulin SerPl Elph-Mcnc: 0.9 g/dL (ref 0.7–1.3)
Gamma Glob SerPl Elph-Mcnc: 0.3 g/dL — ABNORMAL LOW (ref 0.4–1.8)
Globulin, Total: 2 g/dL — ABNORMAL LOW (ref 2.2–3.9)
IgA: 28 mg/dL — ABNORMAL LOW (ref 61–437)
IgG (Immunoglobin G), Serum: 384 mg/dL — ABNORMAL LOW (ref 603–1613)
IgM (Immunoglobulin M), Srm: 8 mg/dL — ABNORMAL LOW (ref 15–143)
Total Protein ELP: 6.1 g/dL (ref 6.0–8.5)

## 2020-09-02 DIAGNOSIS — C9110 Chronic lymphocytic leukemia of B-cell type not having achieved remission: Secondary | ICD-10-CM | POA: Diagnosis not present

## 2020-09-02 DIAGNOSIS — E039 Hypothyroidism, unspecified: Secondary | ICD-10-CM | POA: Diagnosis not present

## 2020-09-02 DIAGNOSIS — D509 Iron deficiency anemia, unspecified: Secondary | ICD-10-CM | POA: Diagnosis not present

## 2020-09-02 DIAGNOSIS — Z1389 Encounter for screening for other disorder: Secondary | ICD-10-CM | POA: Diagnosis not present

## 2020-09-02 DIAGNOSIS — I10 Essential (primary) hypertension: Secondary | ICD-10-CM | POA: Diagnosis not present

## 2020-09-02 DIAGNOSIS — N183 Chronic kidney disease, stage 3 unspecified: Secondary | ICD-10-CM | POA: Diagnosis not present

## 2020-09-02 DIAGNOSIS — E782 Mixed hyperlipidemia: Secondary | ICD-10-CM | POA: Diagnosis not present

## 2020-09-02 DIAGNOSIS — Z0000 Encounter for general adult medical examination without abnormal findings: Secondary | ICD-10-CM | POA: Diagnosis not present

## 2020-09-02 DIAGNOSIS — Z125 Encounter for screening for malignant neoplasm of prostate: Secondary | ICD-10-CM | POA: Diagnosis not present

## 2020-09-02 DIAGNOSIS — E1122 Type 2 diabetes mellitus with diabetic chronic kidney disease: Secondary | ICD-10-CM | POA: Diagnosis not present

## 2020-09-06 DIAGNOSIS — Z125 Encounter for screening for malignant neoplasm of prostate: Secondary | ICD-10-CM | POA: Diagnosis not present

## 2020-09-06 DIAGNOSIS — E782 Mixed hyperlipidemia: Secondary | ICD-10-CM | POA: Diagnosis not present

## 2020-09-06 DIAGNOSIS — I10 Essential (primary) hypertension: Secondary | ICD-10-CM | POA: Diagnosis not present

## 2020-09-06 DIAGNOSIS — C9110 Chronic lymphocytic leukemia of B-cell type not having achieved remission: Secondary | ICD-10-CM | POA: Diagnosis not present

## 2020-09-06 DIAGNOSIS — Z0000 Encounter for general adult medical examination without abnormal findings: Secondary | ICD-10-CM | POA: Diagnosis not present

## 2020-09-06 DIAGNOSIS — E039 Hypothyroidism, unspecified: Secondary | ICD-10-CM | POA: Diagnosis not present

## 2020-09-06 DIAGNOSIS — N183 Chronic kidney disease, stage 3 unspecified: Secondary | ICD-10-CM | POA: Diagnosis not present

## 2020-09-06 DIAGNOSIS — E1122 Type 2 diabetes mellitus with diabetic chronic kidney disease: Secondary | ICD-10-CM | POA: Diagnosis not present

## 2020-09-06 DIAGNOSIS — D509 Iron deficiency anemia, unspecified: Secondary | ICD-10-CM | POA: Diagnosis not present

## 2020-09-08 DIAGNOSIS — J029 Acute pharyngitis, unspecified: Secondary | ICD-10-CM | POA: Diagnosis not present

## 2020-09-08 DIAGNOSIS — U071 COVID-19: Secondary | ICD-10-CM | POA: Diagnosis not present

## 2020-09-09 DIAGNOSIS — Z20822 Contact with and (suspected) exposure to covid-19: Secondary | ICD-10-CM | POA: Diagnosis not present

## 2020-09-09 DIAGNOSIS — J029 Acute pharyngitis, unspecified: Secondary | ICD-10-CM | POA: Diagnosis not present

## 2020-09-19 ENCOUNTER — Other Ambulatory Visit: Payer: Self-pay

## 2020-09-19 ENCOUNTER — Other Ambulatory Visit: Payer: Self-pay | Admitting: *Deleted

## 2020-09-19 ENCOUNTER — Inpatient Hospital Stay: Payer: PPO | Attending: Oncology

## 2020-09-19 DIAGNOSIS — C9110 Chronic lymphocytic leukemia of B-cell type not having achieved remission: Secondary | ICD-10-CM | POA: Diagnosis not present

## 2020-09-19 LAB — COMPREHENSIVE METABOLIC PANEL
ALT: 16 U/L (ref 0–44)
AST: 24 U/L (ref 15–41)
Albumin: 4.4 g/dL (ref 3.5–5.0)
Alkaline Phosphatase: 81 U/L (ref 38–126)
Anion gap: 14 (ref 5–15)
BUN: 23 mg/dL (ref 8–23)
CO2: 22 mmol/L (ref 22–32)
Calcium: 9.8 mg/dL (ref 8.9–10.3)
Chloride: 105 mmol/L (ref 98–111)
Creatinine, Ser: 1.74 mg/dL — ABNORMAL HIGH (ref 0.61–1.24)
GFR, Estimated: 39 mL/min — ABNORMAL LOW (ref >60)
Glucose, Bld: 137 mg/dL — ABNORMAL HIGH (ref 70–99)
Potassium: 4.3 mmol/L (ref 3.5–5.1)
Sodium: 141 mmol/L (ref 135–145)
Total Bilirubin: 0.9 mg/dL (ref 0.3–1.2)
Total Protein: 7.4 g/dL (ref 6.5–8.1)

## 2020-09-19 LAB — CBC WITH DIFFERENTIAL/PLATELET
Abs Immature Granulocytes: 0.33 10*3/uL — ABNORMAL HIGH (ref 0.00–0.07)
Basophils Absolute: 0.2 10*3/uL — ABNORMAL HIGH (ref 0.0–0.1)
Basophils Relative: 0 %
Eosinophils Absolute: 0.1 10*3/uL (ref 0.0–0.5)
Eosinophils Relative: 0 %
HCT: 39.1 % (ref 39.0–52.0)
Hemoglobin: 12 g/dL — ABNORMAL LOW (ref 13.0–17.0)
Immature Granulocytes: 0 %
Lymphocytes Relative: 92 %
Lymphs Abs: 118.9 10*3/uL — ABNORMAL HIGH (ref 0.7–4.0)
MCH: 26.1 pg (ref 26.0–34.0)
MCHC: 30.7 g/dL (ref 30.0–36.0)
MCV: 85 fL (ref 80.0–100.0)
Monocytes Absolute: 4.5 10*3/uL — ABNORMAL HIGH (ref 0.1–1.0)
Monocytes Relative: 4 %
Neutro Abs: 4.6 10*3/uL (ref 1.7–7.7)
Neutrophils Relative %: 4 %
Platelets: 179 10*3/uL (ref 150–400)
RBC: 4.6 MIL/uL (ref 4.22–5.81)
RDW: 15.9 % — ABNORMAL HIGH (ref 11.5–15.5)
WBC: 128.6 10*3/uL (ref 4.0–10.5)
nRBC: 0 % (ref 0.0–0.2)

## 2020-09-19 LAB — LACTATE DEHYDROGENASE: LDH: 168 U/L (ref 98–192)

## 2020-09-20 LAB — IGG, IGA, IGM
IgA: 36 mg/dL — ABNORMAL LOW (ref 61–437)
IgG (Immunoglobin G), Serum: 446 mg/dL — ABNORMAL LOW (ref 603–1613)
IgM (Immunoglobulin M), Srm: 9 mg/dL — ABNORMAL LOW (ref 15–143)

## 2020-09-23 LAB — MULTIPLE MYELOMA PANEL, SERUM
Albumin SerPl Elph-Mcnc: 4.2 g/dL (ref 2.9–4.4)
Albumin/Glob SerPl: 1.7 (ref 0.7–1.7)
Alpha 1: 0.2 g/dL (ref 0.0–0.4)
Alpha2 Glob SerPl Elph-Mcnc: 1 g/dL (ref 0.4–1.0)
B-Globulin SerPl Elph-Mcnc: 1 g/dL (ref 0.7–1.3)
Gamma Glob SerPl Elph-Mcnc: 0.3 g/dL — ABNORMAL LOW (ref 0.4–1.8)
Globulin, Total: 2.5 g/dL (ref 2.2–3.9)
IgA: 36 mg/dL — ABNORMAL LOW (ref 61–437)
IgG (Immunoglobin G), Serum: 427 mg/dL — ABNORMAL LOW (ref 603–1613)
IgM (Immunoglobulin M), Srm: 9 mg/dL — ABNORMAL LOW (ref 15–143)
Total Protein ELP: 6.7 g/dL (ref 6.0–8.5)

## 2020-09-27 ENCOUNTER — Ambulatory Visit: Payer: PPO | Admitting: Oncology

## 2020-10-12 DIAGNOSIS — M79675 Pain in left toe(s): Secondary | ICD-10-CM | POA: Diagnosis not present

## 2020-10-14 DIAGNOSIS — D509 Iron deficiency anemia, unspecified: Secondary | ICD-10-CM | POA: Diagnosis not present

## 2020-10-14 DIAGNOSIS — C9110 Chronic lymphocytic leukemia of B-cell type not having achieved remission: Secondary | ICD-10-CM | POA: Diagnosis not present

## 2020-10-14 DIAGNOSIS — D720 Genetic anomalies of leukocytes: Secondary | ICD-10-CM | POA: Diagnosis not present

## 2020-10-14 DIAGNOSIS — D508 Other iron deficiency anemias: Secondary | ICD-10-CM | POA: Diagnosis not present

## 2020-10-14 DIAGNOSIS — Z79899 Other long term (current) drug therapy: Secondary | ICD-10-CM | POA: Diagnosis not present

## 2020-10-14 DIAGNOSIS — I10 Essential (primary) hypertension: Secondary | ICD-10-CM | POA: Diagnosis not present

## 2020-10-14 DIAGNOSIS — R161 Splenomegaly, not elsewhere classified: Secondary | ICD-10-CM | POA: Diagnosis not present

## 2020-10-14 DIAGNOSIS — E119 Type 2 diabetes mellitus without complications: Secondary | ICD-10-CM | POA: Diagnosis not present

## 2020-10-14 DIAGNOSIS — E785 Hyperlipidemia, unspecified: Secondary | ICD-10-CM | POA: Diagnosis not present

## 2020-10-14 DIAGNOSIS — E790 Hyperuricemia without signs of inflammatory arthritis and tophaceous disease: Secondary | ICD-10-CM | POA: Diagnosis not present

## 2020-10-14 DIAGNOSIS — Z7984 Long term (current) use of oral hypoglycemic drugs: Secondary | ICD-10-CM | POA: Diagnosis not present

## 2020-10-14 DIAGNOSIS — D696 Thrombocytopenia, unspecified: Secondary | ICD-10-CM | POA: Diagnosis not present

## 2020-10-14 DIAGNOSIS — Z85820 Personal history of malignant melanoma of skin: Secondary | ICD-10-CM | POA: Diagnosis not present

## 2020-10-14 DIAGNOSIS — Z85828 Personal history of other malignant neoplasm of skin: Secondary | ICD-10-CM | POA: Diagnosis not present

## 2020-10-20 ENCOUNTER — Inpatient Hospital Stay: Payer: PPO | Attending: Oncology | Admitting: Oncology

## 2020-10-20 ENCOUNTER — Other Ambulatory Visit: Payer: Self-pay

## 2020-10-20 VITALS — BP 118/57 | HR 57 | Temp 97.7°F | Resp 18 | Wt 149.3 lb

## 2020-10-20 DIAGNOSIS — E119 Type 2 diabetes mellitus without complications: Secondary | ICD-10-CM | POA: Diagnosis not present

## 2020-10-20 DIAGNOSIS — G4730 Sleep apnea, unspecified: Secondary | ICD-10-CM | POA: Diagnosis not present

## 2020-10-20 DIAGNOSIS — I252 Old myocardial infarction: Secondary | ICD-10-CM | POA: Insufficient documentation

## 2020-10-20 DIAGNOSIS — Z85820 Personal history of malignant melanoma of skin: Secondary | ICD-10-CM | POA: Diagnosis not present

## 2020-10-20 DIAGNOSIS — C9110 Chronic lymphocytic leukemia of B-cell type not having achieved remission: Secondary | ICD-10-CM | POA: Insufficient documentation

## 2020-10-20 DIAGNOSIS — Z79899 Other long term (current) drug therapy: Secondary | ICD-10-CM | POA: Insufficient documentation

## 2020-10-20 DIAGNOSIS — I2510 Atherosclerotic heart disease of native coronary artery without angina pectoris: Secondary | ICD-10-CM | POA: Insufficient documentation

## 2020-10-20 DIAGNOSIS — Z7984 Long term (current) use of oral hypoglycemic drugs: Secondary | ICD-10-CM | POA: Diagnosis not present

## 2020-10-20 DIAGNOSIS — I10 Essential (primary) hypertension: Secondary | ICD-10-CM | POA: Insufficient documentation

## 2020-10-20 NOTE — Progress Notes (Signed)
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    He is followed by Dr. Florene Glen at [facility], with the most recent visit there 10/14/2020.  At that time [name]'s white cell count was 166.2, absolute lymphocyte count 154.6, absolute neutrophil count 10.0, hemoglobin 11.8 and platelets 162,000.  Other labs included an LDH of 247, creatinine of 1.48, GFR 48, calcium 9.3, albumin 4.8, and total protein 7.1, and vitamin B-12 of 547.  I do not find a uric acid or IVIG level.  REVIEW OF SYSTEMS: [name] frequently travels to [facility] to visit his son there [name] is planning to remain in [place] long-term.  He has also visited his son in [place] who he tells me in the end may actually have autism as his primary problem.  [name] tells me he had shingles which remains a little painful.  He also had COVID August 2022.  He was treated with back Slo-Bid.  Had some fever and fatigue as the [name] symptoms.  His only new concern is a little bit of difficulty with balance.  He has not had any falls.  He just feels a little bit unsteady.  He has had no headaches visual changes nausea or vomiting.  Aside from that a detailed review of systems was stable  COVID: Status post [place] x3; history of COVID August 2022, treated with Paxlobid  HISTORY OF PRESENT ILLNESS: From the earlier summary notes:  The patient had had multiple liver function tests and electrolytes, but no complete blood count since 1998 (that one apparently was normal) until he was referred to [facility] for evaluation of muscle aches, possibly related to his  cholesterol-lowering medication.  As part of the workup, Dr. Rockwell Alexandria obtained a complete blood count on Jun 15, 2002, which showed a hemoglobin of 13.3, MCV 79.5, platelets 178,000 and a white cell count of 37.3, with 80% lymphocytes  The absolute lymphocyte count was 30,000.   His subsequent history is detailed below   PAST MEDICAL HISTORY: Past Medical History:  Diagnosis Date   Anxiety    Chronic lymphoid leukemia (without mention of remission) 12/22/2010   Coronary artery disease    s/p CABG 1998   Diabetes mellitus without complication ([place])    Type II   Dysplastic nevus 01/1996   melanoma 2013 followed by dermatologist Dr Denna Haggard   Gout, unspecified 12/22/2010   Hypertension    Lymphocytic leukemia ([place])    Chronic followed by Dr Florene Glen at [facility]   MI (myocardial infarction) [facility]) 1998   Mixed dyslipidemia    Wears partial dentures   Significant for hypertension, diabetes, hypercholesterolemia, coronary artery disease status post coronary artery bypass graft in 1998, history of septoplasty, history of sleep apnea, which apparently has resolved with aggressive treatment of his rhinitis, history of colonic polyps, history of peptic ulcer disease and history of remote and minimal tobacco abuse.    PAST SURGICAL HISTORY: Past Surgical History:  Procedure Laterality Date   CARDIAC CATHETERIZATION     CHOLECYSTECTOMY  2010   COLONOSCOPY     CORONARY ARTERY BYPASS GRAFT  1998   X7   HERNIA  REPAIR  2010   rting hernia   TRIGGER FINGER RELEASE Left 04/30/2014   Procedure: RELEASE A-1 PULLEYS LEFT INDEX AND LEFT LONG FINGERS ;  Surgeon: Daryll Brod, MD;  Location: [facility];  Service: Orthopedics;  Laterality: Left;    FAMILY HISTORY Mr. Simmers's father died at the age of 51 from "old age" according to Mr. Delsol.  His mother died at 10 with a cerebrovascular accident.  She had many children, of whom 12 made it to adulthood.  All of his brothers older than he (with 1  exception) have had prostate cancer.     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 [name]  lives alone but is considering eventually moving to a retirement community.. He has 2 children surviving.  One son was born with hyaline membrane and died suddenly at the age of 27.  Son [name] teaches poly-sci in [place].  [name] has a daughter, the patient's only grandchild. Son [name] lives in [place]. He has significant psychosocial issues not documented here. The Ropers are Mormons.     ADVANCED DIRECTIVES: in place   HEALTH MAINTENANCE: Social History   Tobacco Use   Smoking status: Never  Substance Use Topics   Alcohol use: No   Drug use: No     Colonoscopy:  PSA: <4 per patient's report  Bone density:  Lipid panel:  No Known Allergies  Current Outpatient Medications  Medication Sig Dispense Refill   empagliflozin (JARDIANCE) 10 MG TABS tablet Take 1 tablet (10 mg total) by mouth daily before breakfast. 30 tablet    IRON, FERROUS GLUCONATE, PO Take by mouth. 365     levothyroxine (SYNTHROID) 25 MCG tablet Take 50 mcg by mouth daily.     metFORMIN (GLUCOPHAGE) 500 MG tablet Take 500 mg by mouth 2 (two) times daily with a meal.     metoprolol succinate (TOPROL-XL) 50 MG 24 hr tablet Take 50 mg by mouth daily. Take with or immediately following a meal.     nateglinide (STARLIX) 60 MG tablet Take 60 mg by mouth 3 (three) times daily before meals.     pravastatin (PRAVACHOL) 80 MG tablet Take 80 mg by mouth daily.     quinapril (ACCUPRIL) 10 MG tablet Take 10 mg by mouth at bedtime.     No current facility-administered medications for this visit.    OBJECTIVE: White man who appears well Vitals:   10/20/20 1119  BP: (!) 118/57  Pulse: (!) 57  Resp: 18  Temp: 97.7 F (36.5 C)  SpO2: 100%      Body mass index is 24.84 kg/m.    ECOG FS: 1  Sclerae unicteric, EOMs  intact Wearing a mask No cervical or supraclavicular adenopathy, no axillary or inguinal adenopathy Lungs no rales or rhonchi Heart regular rate and rhythm Abd soft, nontender, positive bowel sounds MSK no focal spinal tenderness, no upper extremity lymphedema Neuro: nonfocal, well oriented, appropriate affect; able to stand on his heels; gait is normal   LAB RESULTS:  Lab Results  Component Value Date   WBC 128.6 (HH) 09/19/2020   NEUTROABS 4.6 09/19/2020   HGB 12.0 (L) 09/19/2020   HCT 39.1 09/19/2020   MCV 85.0 09/19/2020   PLT 179 09/19/2020      Chemistry      Component Value Date/Time   NA 141 09/19/2020 1129   NA 142  08/28/2016 1123   K 4.3 09/19/2020 1129   K 4.6 08/28/2016 1123   CL 105 09/19/2020 1129   CL 109 (H) 07/08/2012 1321   CO2 22 09/19/2020 1129   CO2 25 08/28/2016 1123   BUN 23 09/19/2020 1129   BUN 22.9 08/28/2016 1123   CREATININE 1.74 (H) 09/19/2020 1129   CREATININE 1.5 (H) 08/28/2016 1123      Component Value Date/Time   CALCIUM 9.8 09/19/2020 1129   CALCIUM 10.0 08/28/2016 1123   ALKPHOS 81 09/19/2020 1129   ALKPHOS 77 08/28/2016 1123   AST 24 09/19/2020 1129   AST 17 08/28/2016 1123   ALT 16 09/19/2020 1129   ALT 12 08/28/2016 1123   BILITOT 0.9 09/19/2020 1129   BILITOT 1.20 08/28/2016 1123      No results found for: LABCA2  No components found for: LABCA125  No results for input(s): INR in the last 168 hours.  Urinalysis    Component Value Date/Time   LABSPEC 1.010 04/24/2007 1443   PHURINE 6.5 04/24/2007 1443   HGBUR Trace 04/24/2007 1443   BILIRUBINUR Negative 04/24/2007 1443   KETONESUR Negative 04/24/2007 1443   PROTEINUR Negative 04/24/2007 1443   NITRITE Negative 04/24/2007 1443   LEUKOCYTESUR Negative 04/24/2007 1443    STUDIES: No results found.   ASSESSMENT: 80 y.o. 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  He has not accepted treatment to date    PLAN:  [name] is now 18 years out from initial diagnosis of chronic lymphoid leukemia, which he has chosen to observe and not treat.  He has an excellent hemoglobin and an adequate neutrophil and platelet count.  He has an excellent functional status and exercises regularly, walking 5 miles almost every day.  He is also followed by Jerrye Noble at [facility]  We discussed [place].  [name] understands that although he has had COVID and has been immunized he is unlikely to have made adequate antibodies.  A view Janan Halter is passive antibody immunity given into each buttock as shot every 66months.  I gave him the information and he will let me know if he wishes to receive it.  He understands that his total immunoglobulin level may be low.  It has not been checked recently.  If it is very low and he has a significant infection it would be useful to dosing with IVIG in addition to what ever antibiotics he receives.  Despite his excellent follow-up at [facility] he would like to continue to be followed here and I have made him a return appointment a year from now.  He knows we will be glad to see him before then as needed.  Total encounter time 25 minutes.*  Meriah Shands, Virgie Dad, MD  10/20/20 3:21 PM Medical Oncology and Hematology [facility] [place], [place] [address] Tel. [phone]    Fax. [phone]   I, Wilburn Mylar, am acting as scribe for Dr. Virgie Dad. Akhila Mahnken.  I, Lurline Del MD, have reviewed the above documentation for accuracy and completeness, and I agree with the above.    *Total Encounter Time as defined by the Centers for Medicare and Medicaid Services includes, in addition to the face-to-face time of a patient visit (documented in the note above) non-face-to-face time: obtaining and reviewing outside history,  ordering and reviewing medications, tests or procedures, care coordination (communications with other health care professionals or caregivers) and documentation in the medical record.

## 2020-11-09 DIAGNOSIS — E782 Mixed hyperlipidemia: Secondary | ICD-10-CM | POA: Diagnosis not present

## 2020-11-09 DIAGNOSIS — D509 Iron deficiency anemia, unspecified: Secondary | ICD-10-CM | POA: Diagnosis not present

## 2020-11-09 DIAGNOSIS — N183 Chronic kidney disease, stage 3 unspecified: Secondary | ICD-10-CM | POA: Diagnosis not present

## 2020-11-09 DIAGNOSIS — E781 Pure hyperglyceridemia: Secondary | ICD-10-CM | POA: Diagnosis not present

## 2020-11-09 DIAGNOSIS — G4700 Insomnia, unspecified: Secondary | ICD-10-CM | POA: Diagnosis not present

## 2020-11-09 DIAGNOSIS — E039 Hypothyroidism, unspecified: Secondary | ICD-10-CM | POA: Diagnosis not present

## 2020-11-09 DIAGNOSIS — I10 Essential (primary) hypertension: Secondary | ICD-10-CM | POA: Diagnosis not present

## 2020-11-09 DIAGNOSIS — E1122 Type 2 diabetes mellitus with diabetic chronic kidney disease: Secondary | ICD-10-CM | POA: Diagnosis not present

## 2020-11-09 DIAGNOSIS — I2510 Atherosclerotic heart disease of native coronary artery without angina pectoris: Secondary | ICD-10-CM | POA: Diagnosis not present

## 2020-12-01 DIAGNOSIS — N1832 Chronic kidney disease, stage 3b: Secondary | ICD-10-CM | POA: Diagnosis not present

## 2020-12-07 DIAGNOSIS — N1832 Chronic kidney disease, stage 3b: Secondary | ICD-10-CM | POA: Diagnosis not present

## 2020-12-07 DIAGNOSIS — I2510 Atherosclerotic heart disease of native coronary artery without angina pectoris: Secondary | ICD-10-CM | POA: Diagnosis not present

## 2020-12-07 DIAGNOSIS — D631 Anemia in chronic kidney disease: Secondary | ICD-10-CM | POA: Diagnosis not present

## 2020-12-07 DIAGNOSIS — E559 Vitamin D deficiency, unspecified: Secondary | ICD-10-CM | POA: Diagnosis not present

## 2020-12-07 DIAGNOSIS — I129 Hypertensive chronic kidney disease with stage 1 through stage 4 chronic kidney disease, or unspecified chronic kidney disease: Secondary | ICD-10-CM | POA: Diagnosis not present

## 2020-12-07 DIAGNOSIS — C9110 Chronic lymphocytic leukemia of B-cell type not having achieved remission: Secondary | ICD-10-CM | POA: Diagnosis not present

## 2021-07-04 ENCOUNTER — Telehealth: Payer: Self-pay | Admitting: Hematology and Oncology

## 2021-07-04 NOTE — Telephone Encounter (Signed)
Per 6/13 provider reschedule.  Called and left message for pt about appointment reschedule  details and call back number left

## 2021-10-06 ENCOUNTER — Inpatient Hospital Stay: Payer: PPO | Attending: Family Medicine

## 2021-10-06 ENCOUNTER — Other Ambulatory Visit: Payer: Self-pay | Admitting: Hematology and Oncology

## 2021-10-06 DIAGNOSIS — C9110 Chronic lymphocytic leukemia of B-cell type not having achieved remission: Secondary | ICD-10-CM

## 2021-10-16 ENCOUNTER — Inpatient Hospital Stay: Payer: PPO

## 2021-10-16 ENCOUNTER — Inpatient Hospital Stay: Payer: PPO | Admitting: Hematology and Oncology

## 2021-10-20 ENCOUNTER — Ambulatory Visit: Payer: PPO | Admitting: Hematology and Oncology

## 2022-07-15 IMAGING — US US RENAL
1 series · 13 of 25 positions shown · non-contrast
Comparison: None available.

CLINICAL DATA: Initial evaluation for stage III B chronic kidney
disease.

EXAM:
RENAL / URINARY TRACT ULTRASOUND COMPLETE

[Series 1: us renal · 0.25mm/px · 72 acquisitions, 13 frames shown]
[im 1/72]
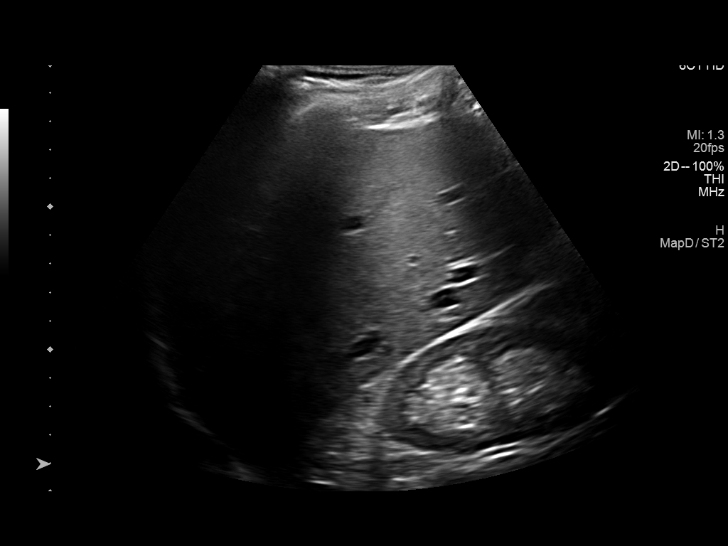
[im 6/72]
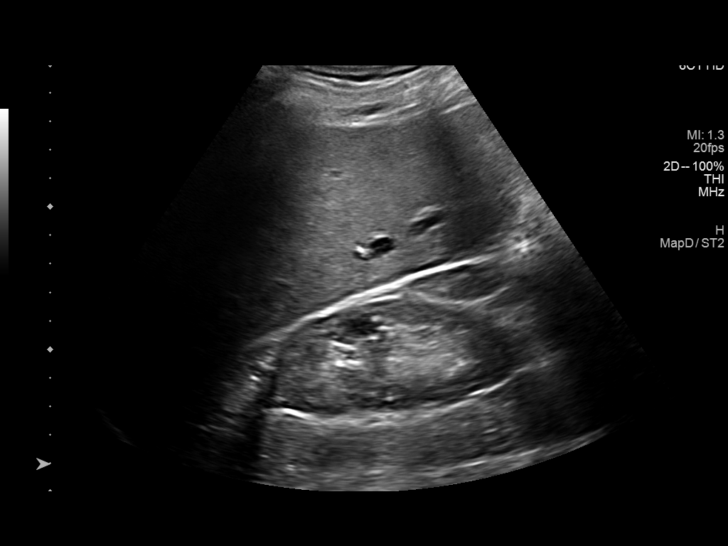
[im 12/72]
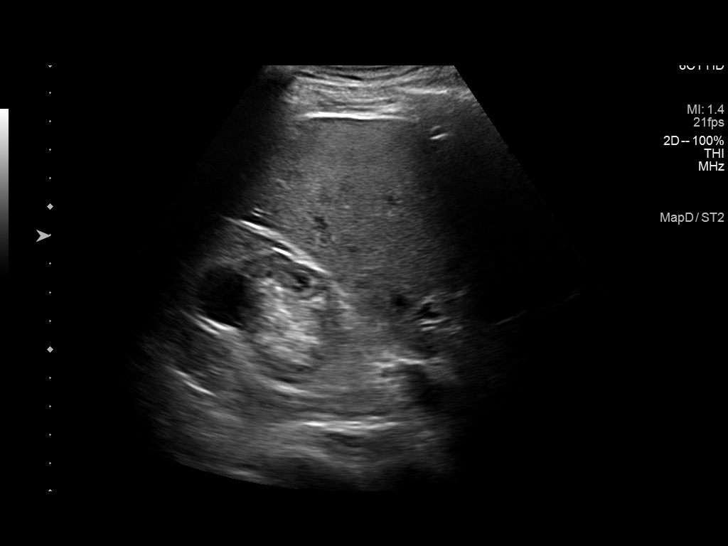
[im 18/72]
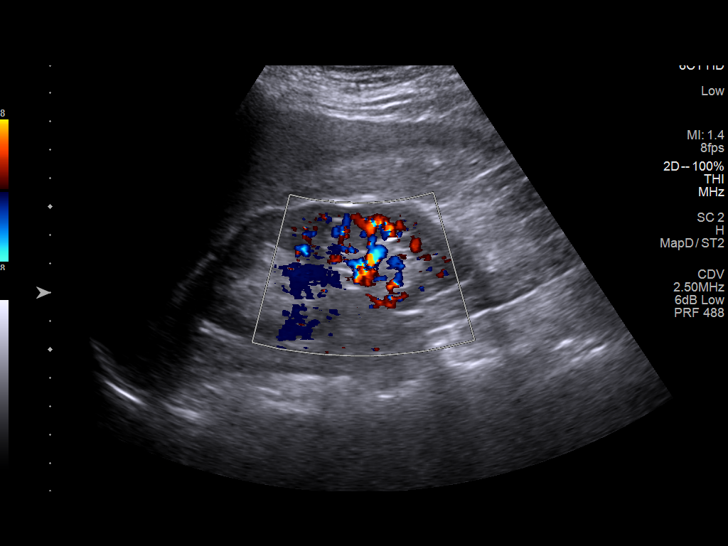
[im 24/72]
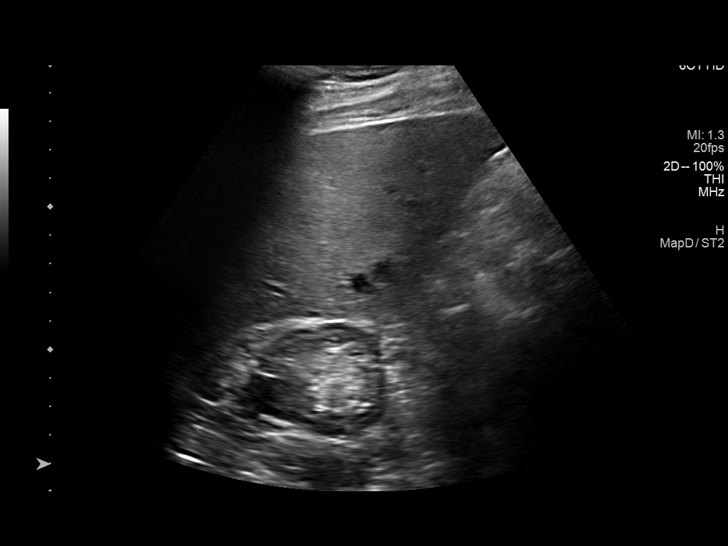
[im 30/72]
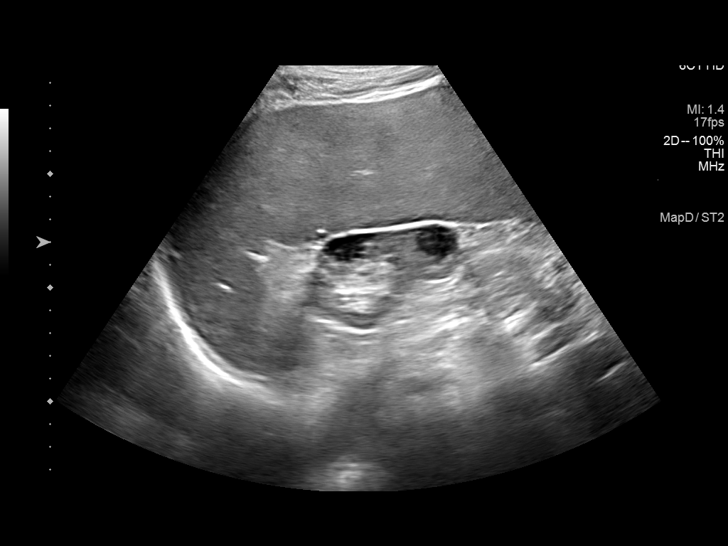
[im 36/72]
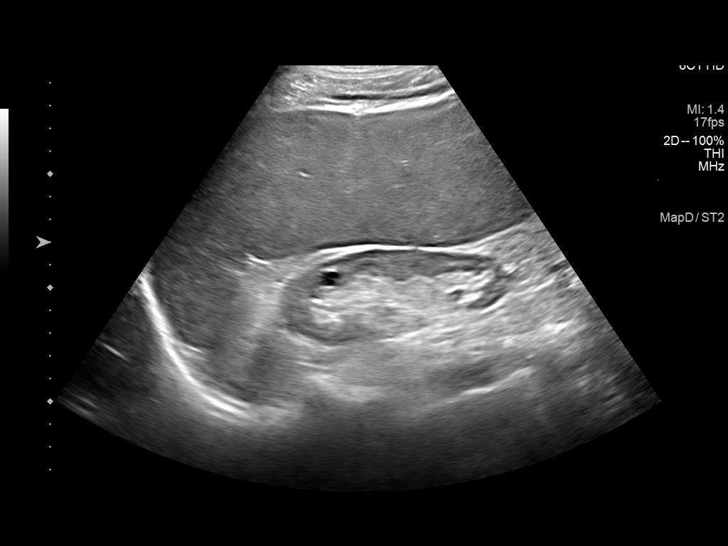
[im 42/72]
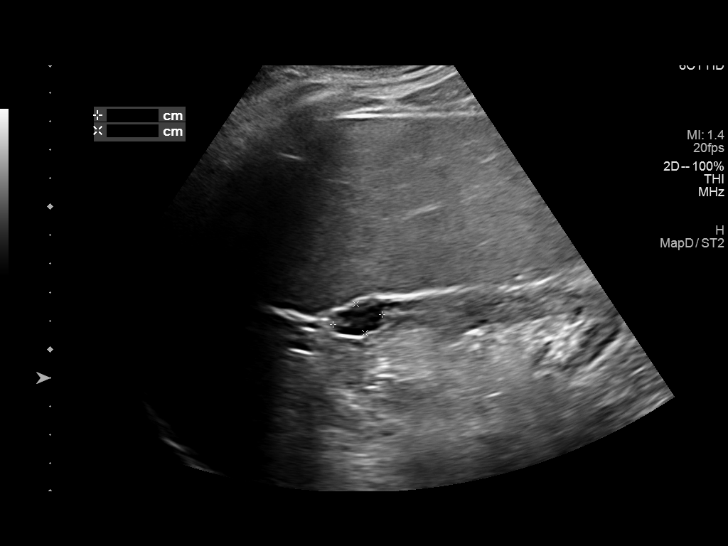
[im 48/72]
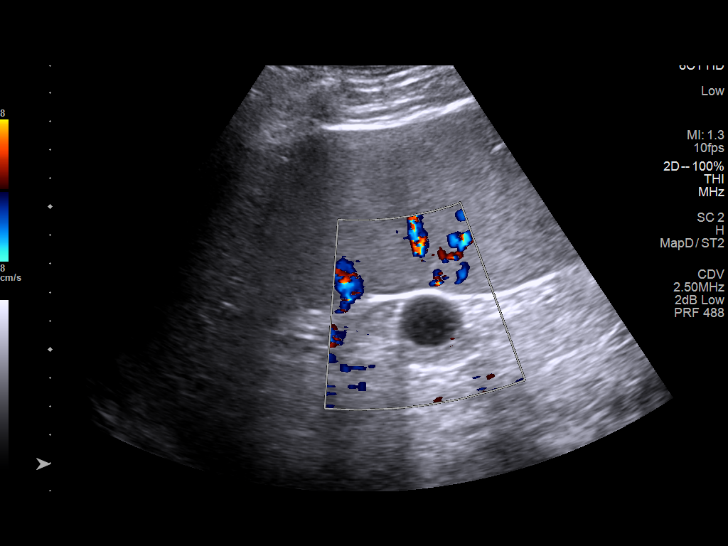
[im 54/72]
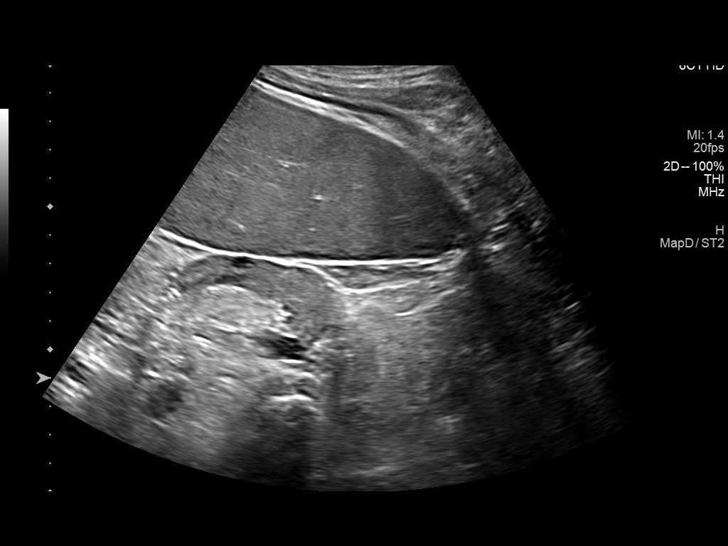
[im 60/72]
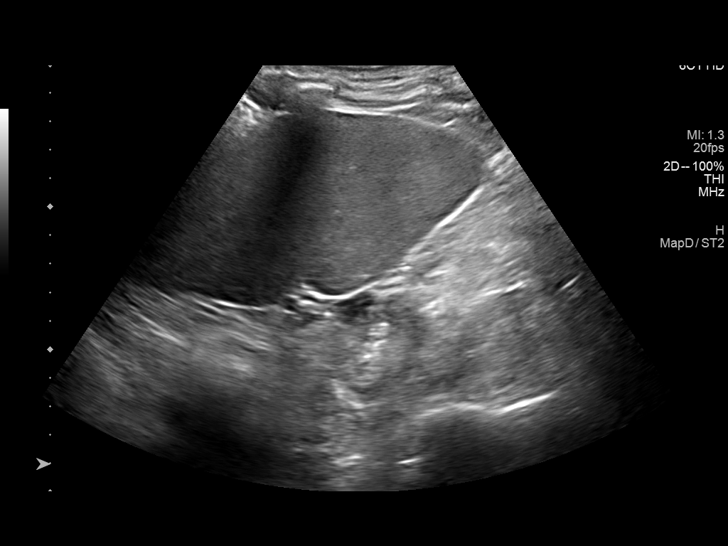
[im 66/72]
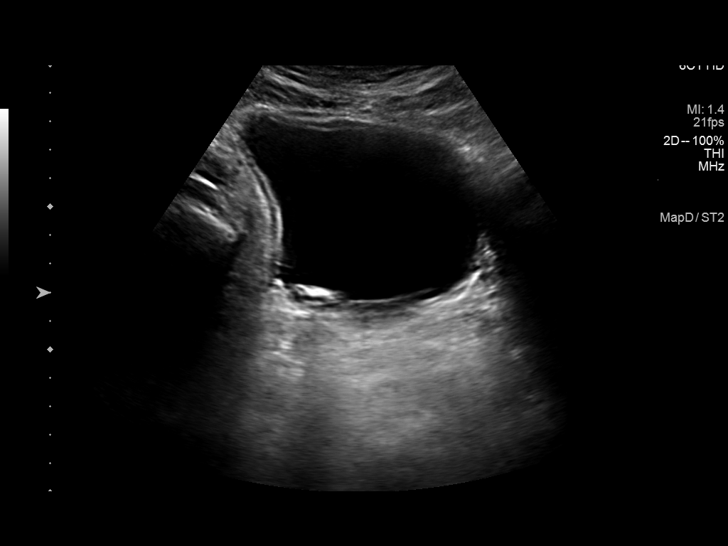
[im 72/72]
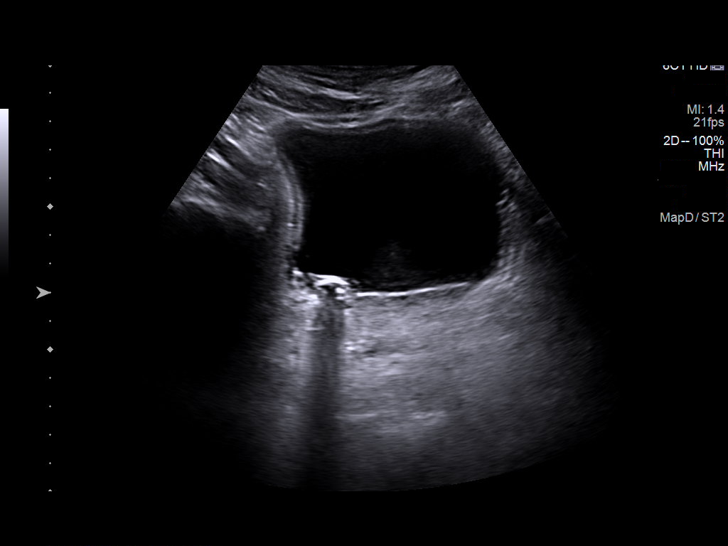

[13 of 25 positions shown; findings below may reference images not displayed]

FINDINGS: Right Kidney:

Renal measurements: 9.7 x 4.3 x 4.4 cm = volume: 95.8 mL. Diffuse
cortical thinning with mildly increased echogenicity within the
renal parenchyma. No nephrolithiasis or hydronephrosis. 2.2 x 2.1 x
1.9 cm simple cyst present at the interpolar region. Additional
x 0.9 x 0.8 cm simple cyst noted at the interpolar kidney as well.

Left Kidney:

Renal measurements: 10.1 x 4.0 x 4.4 cm = volume: 91.6 mL. Diffuse
cortical thinning with increased echogenicity within the renal
parenchyma. No nephrolithiasis or hydronephrosis. 1.8 x 1.1 x 1.5 cm
benign appearing cyst present at the upper pole. Additional 2.3 x
1.9 x 2.0 cm simple cyst present at the interpolar region.
Additional small 1.7 cm benign appearing cystic lesion noted within
the interpolar region as well.

Bladder:

Shadowing echogenic focus seen layering within the posterior bladder
lumen measures up to 1.3 cm, consistent with a bladder calculus.

Other:

Enlarged prostate measures 4.9 x 4.4 x 5.6 cm.

Incidental note made of splenomegaly with the spleen measuring up to
15.8 cm in diameter.
IMPRESSION: 1. Diffuse cortical thinning with increased echogenicity within the
renal parenchyma, compatible with chronic medical renal disease. No
hydronephrosis.
2. 1.3 cm bladder calculus.
3. Enlarged prostate.
4. Splenomegaly.
5. Few scattered benign appearing bilateral renal cysts as above.
